# Patient Record
Sex: Female | Born: 1960 | Race: White | Hispanic: No
Health system: Midwestern US, Community
[De-identification: ages and names within clinical notes are randomized; demographics above are authoritative.]

## PROBLEM LIST (undated history)

## (undated) DIAGNOSIS — Z981 Arthrodesis status: Secondary | ICD-10-CM

## (undated) DIAGNOSIS — M4802 Spinal stenosis, cervical region: Secondary | ICD-10-CM

## (undated) DIAGNOSIS — G959 Disease of spinal cord, unspecified: Principal | ICD-10-CM

## (undated) DIAGNOSIS — M5412 Radiculopathy, cervical region: Secondary | ICD-10-CM

---

## 2011-08-27 LAB — URINALYSIS W/ RFLX MICROSCOPIC
Bilirubin: NEGATIVE
Blood: NEGATIVE
Glucose: NEGATIVE MG/DL
Ketone: NEGATIVE MG/DL
Leukocyte Esterase: NEGATIVE
Nitrites: NEGATIVE
Protein: NEGATIVE MG/DL
Specific gravity: 1.017 (ref 1.001–1.023)
Urobilinogen: 0.2 EU/DL (ref 0.2–1.0)
pH (UA): 7 (ref 5.0–9.0)

## 2011-08-27 LAB — CBC W/O DIFF
HCT: 35.8 % (ref 35.8–46.3)
HGB: 11.8 g/dL (ref 11.7–15.4)
MCH: 29.6 PG (ref 26.1–32.9)
MCHC: 33 g/dL (ref 31.4–35.0)
MCV: 89.9 FL (ref 79.6–97.8)
MPV: 11.5 FL (ref 10.8–14.1)
PLATELET: 273 10*3/uL (ref 150–450)
RBC: 3.98 M/uL — ABNORMAL LOW (ref 4.05–5.25)
RDW: 14.5 % (ref 11.9–14.6)
WBC: 7.7 10*3/uL (ref 4.3–11.1)

## 2011-08-27 LAB — METABOLIC PANEL, BASIC
Anion gap: 9 mmol/L (ref 7–16)
BUN: 9 MG/DL (ref 6–23)
CO2: 27 MMOL/L (ref 21–32)
Calcium: 8.7 MG/DL (ref 8.3–10.4)
Chloride: 105 MMOL/L (ref 98–107)
Creatinine: 0.72 MG/DL (ref 0.6–1.0)
GFR est AA: >60 mL/min/{1.73_m2} (ref >60)
GFR est non-AA: >60 mL/min/{1.73_m2} (ref >60)
Glucose: 88 MG/DL (ref 65–100)
Potassium: 4 MMOL/L (ref 3.5–5.1)
Sodium: 141 MMOL/L (ref 136–145)

## 2011-08-27 LAB — MSSA/MRSA SC BY PCR, NASAL SWAB

## 2011-08-27 NOTE — Other (Signed)
Negative results for MRSA/SA screening noted.

## 2011-08-27 NOTE — Other (Addendum)
Patient's guide to surgery given along with Pt education sheets regarding transfusions, pain management,smoking,and hand hygiene for the family and community. Pt verbalizes understanding of all pre-op instructions . Reinforced nothing to eat or drink after midnight on the day prior to surgery , this includes gum, mints, ice chips or water. Instructed that family must be present in building at all times.    Instructed Patient that they will be notified the day prior to surgery ( or the Friday before if surgery is on a Monday) of their arrival time by pre-op nurse with  Verbal understanding. Pt aware to bathe or shower with antibacterial soap on the night before  And am of surgery.      One packet Hibiclens given to pt along with education sheet. Instructed pt to use on the am of surgery and  to avoid face and genitals with hibiclens. Instructed to wear freshly laundered clothes.     No audible murmur heard at pre-assessment.     Patient safety information sheet given per registration with numbers for patient relations: [phone] and patient safety [phone]. Instructed pt that for any family member to obtain information or updates on pt's condition that they must have 4 digit code provided to them at registration. Pt verbalized understanding.     Instructed patient to continue  previous medications as prescribed prior to surgery and  to take cymbalta and opana if needed  On the Day of surgery with sip of water.  Medication reconciliation report given. Do not hold any medications that you have not been instructed to do either at your pre-assessment or per your surgeon.    Continue all previous medications unless otherwise directed.      Instructed patient to stop the following medications prior to surgery: none    TYPE   3  CASE   Labs per surgeon :no orders, MRSA/SA nasal swab, UA, incentive spirometry  Labs per grid :CBC BMP type and screen DOS  EKG  :  Not needed per protocol.   Spoke to [place] in [employer], reported to follow up with nasal swab.

## 2011-08-28 NOTE — Other (Signed)
Pt & Ptt need to be drawn DOS

## 2011-09-02 ENCOUNTER — Inpatient Hospital Stay
Admit: 2011-09-02 | Discharge: 2011-09-04 | Disposition: A | Discharge status: Home / Self Care | Payer: Worker's Compensation | Attending: Orthopaedic Surgery | Admitting: Orthopaedic Surgery

## 2011-09-02 DIAGNOSIS — M48062 Spinal stenosis, lumbar region with neurogenic claudication: Secondary | ICD-10-CM

## 2011-09-02 LAB — TYPE AND SCREEN
ABO/Rh: A POS
Antibody Screen: NEGATIVE

## 2011-09-02 LAB — PROTHROMBIN TIME + INR
INR: 1 (ref 0.9–1.2)
Prothrombin time: 10.7 s (ref 9.4–10.8)

## 2011-09-02 LAB — HCG URINE, QL. - POC: Pregnancy test,urine (POC): NEGATIVE

## 2011-09-02 LAB — TYPE & SCREEN
ABO/Rh(D): A POS
Antibody screen: NEGATIVE

## 2011-09-02 LAB — CBC W/O DIFF
HCT: 32.3 % — ABNORMAL LOW (ref 35.8–46.3)
HGB: 10.5 g/dL — ABNORMAL LOW (ref 11.7–15.4)
MCH: 29.4 PG (ref 26.1–32.9)
MCHC: 32.5 g/dL (ref 31.4–35.0)
MCV: 90.5 FL (ref 79.6–97.8)
MPV: 11.1 FL (ref 10.8–14.1)
PLATELET: 252 10*3/uL (ref 150–450)
RBC: 3.57 M/uL — ABNORMAL LOW (ref 4.05–5.25)
RDW: 14.3 % (ref 11.9–14.6)
WBC: 13.5 10*3/uL — ABNORMAL HIGH (ref 4.3–11.1)

## 2011-09-02 LAB — GLUCOSE, POC: Glucose (POC): 79 mg/dL (ref 65–100)

## 2011-09-02 LAB — PTT: aPTT: 26.8 s (ref 23.5–31.7)

## 2011-09-02 MED ORDER — ACETAMINOPHEN 1,000 MG/100 ML (10 MG/ML) IV
1000 mg/100 mL (10 mg/mL) | Freq: Once | INTRAVENOUS | Status: AC
Start: 2011-09-02 — End: 2011-09-02
  Administered 2011-09-02: 18:00:00 via INTRAVENOUS

## 2011-09-02 MED ORDER — SODIUM CHLORIDE 0.9 % IV PIGGY BACK
1 gram | Freq: Once | INTRAVENOUS | Status: AC
Start: 2011-09-02 — End: 2011-09-02
  Administered 2011-09-02: 16:00:00 via INTRAVENOUS

## 2011-09-02 MED ADMIN — rocuronium (ZEMURON) injection: INTRAVENOUS | @ 16:00:00 | NDC 67457022810

## 2011-09-02 MED ADMIN — ondansetron (ZOFRAN) injection: INTRAVENOUS | @ 18:00:00 | NDC 00781301095

## 2011-09-02 MED ADMIN — fentaNYL citrate (PF) injection: INTRAVENOUS | @ 16:00:00 | NDC 10019003867

## 2011-09-02 MED ADMIN — HYDROmorphone (DILAUDID) 20 mg / 20 mL PCA: INTRAVENOUS | @ 20:00:00 | NDC 99991090520

## 2011-09-02 MED ADMIN — dexamethasone (DECADRON) 4 mg/mL injection: INTRAVENOUS | @ 16:00:00 | NDC 63323016501

## 2011-09-02 MED ADMIN — fentaNYL citrate (PF) injection: INTRAVENOUS | @ 19:00:00 | NDC 10019003867

## 2011-09-02 MED ADMIN — ceFAZolin (ANCEF) 1 g in sodium chloride irrigation 0.9 % 1,000 mL solution: @ 17:00:00 | NDC 00781345196

## 2011-09-02 MED ADMIN — docusate sodium (COLACE) capsule 100 mg: ORAL | @ 21:00:00 | NDC 62584068311

## 2011-09-02 MED ADMIN — fentaNYL citrate (PF) injection: INTRAVENOUS | @ 17:00:00 | NDC 10019003867

## 2011-09-02 MED ADMIN — HYDROmorphone (DILAUDID) injection 0.5 mg: INTRAVENOUS | @ 19:00:00 | NDC 00641012121

## 2011-09-02 MED ADMIN — HYDROmorphone (DILAUDID) injection 0.5 mg: INTRAVENOUS | @ 20:00:00 | NDC 00641012121

## 2011-09-02 MED ADMIN — lidocaine (PF) (XYLOCAINE) 20 mg/mL (2 %) injection: INTRAVENOUS | @ 16:00:00 | NDC 00409428202

## 2011-09-02 MED ADMIN — propofol (DIPRIVAN) 10 mg/mL injection: INTRAVENOUS | @ 16:00:00 | NDC 63323027025

## 2011-09-02 MED ADMIN — dextrose 5% - 0.45% NaCl with KCl 20 mEq/L infusion: INTRAVENOUS | @ 21:00:00 | NDC 00409790209

## 2011-09-02 MED ADMIN — ondansetron (ZOFRAN) injection 4 mg: INTRAVENOUS | @ 20:00:00 | NDC 00409475503

## 2011-09-02 MED ADMIN — famotidine (PEPCID) tablet 20 mg: ORAL | @ 14:00:00 | NDC 68084017211

## 2011-09-02 MED ADMIN — lactated ringers infusion: INTRAVENOUS | @ 16:00:00 | NDC 00409795309

## 2011-09-02 MED ADMIN — sodium chloride (NS) flush 5-10 mL: INTRAVENOUS | NDC 87701099893

## 2011-09-02 MED ADMIN — rocuronium (ZEMURON) injection: INTRAVENOUS | @ 18:00:00 | NDC 67457022810

## 2011-09-02 MED ADMIN — HYDROmorphone (DILAUDID) 20 mg / 20 mL PCA: INTRAVENOUS | NDC 99991090520

## 2011-09-02 MED ADMIN — oxyCODONE IR (ROXICODONE) tablet 5 mg: ORAL | @ 19:00:00 | NDC 72162174902

## 2011-09-02 MED ADMIN — glycopyrrolate (ROBINUL) injection: INTRAMUSCULAR | @ 19:00:00 | NDC 00517460225

## 2011-09-02 MED ADMIN — midazolam (VERSED) injection 2 mg: INTRAVENOUS | @ 15:00:00 | NDC 00409230517

## 2011-09-02 MED ADMIN — ondansetron (ZOFRAN) injection 4 mg: INTRAVENOUS | @ 21:00:00 | NDC 00409475503

## 2011-09-02 MED ADMIN — oxymorphone (OPANA) tablet Tab 5 mg: ORAL | @ 22:00:00 | NDC 63481076106

## 2011-09-02 MED ADMIN — fentaNYL citrate (PF) injection: INTRAVENOUS | @ 18:00:00 | NDC 10019003867

## 2011-09-02 MED ADMIN — neostigmine (PROSTIGMINE) injection: INTRAVENOUS | @ 19:00:00 | NDC 10019027010

## 2011-09-02 MED ADMIN — HYDROmorphone (DILAUDID) 20 mg / 20 mL PCA: INTRAVENOUS | @ 21:00:00 | NDC 99991090520

## 2011-09-02 MED ADMIN — phenylephrine 100 mcg/mL syringe (for anesthesia use only): INTRAVENOUS | @ 17:00:00 | NDC 00641614201

## 2011-09-02 MED ADMIN — thrombin (bovine) (THROMBIN-JMI) 5,000 unit topical solution: TOPICAL | @ 17:00:00 | NDC 52604710201

## 2011-09-02 MED ADMIN — lactated ringers infusion: INTRAVENOUS | @ 19:00:00 | NDC 11845118709

## 2011-09-02 MED ADMIN — sodium chloride (NS) flush 5-10 mL: INTRAVENOUS | @ 21:00:00 | NDC 87701099893

## 2011-09-02 MED ADMIN — 0.9% sodium chloride infusion: INTRAVENOUS | @ 17:00:00 | NDC 00409798423

## 2011-09-02 MED ADMIN — 0.9% sodium chloride infusion: INTRAVENOUS | @ 18:00:00 | NDC 00409798423

## 2011-09-02 MED ADMIN — lactated ringers infusion: INTRAVENOUS | @ 14:00:00 | NDC 00409795309

## 2011-09-02 MED ADMIN — lidocaine (XYLOCAINE) 10 mg/mL (1 %) injection 0.1 mL: SUBCUTANEOUS | @ 14:00:00 | NDC 00409427601

## 2011-09-02 MED FILL — ONDANSETRON (PF) 4 MG/2 ML INJECTION: 4 mg/2 mL | INTRAMUSCULAR | Qty: 2

## 2011-09-02 MED FILL — HYDROMORPHONE 2 MG/ML INJECTION SOLUTION: 2 mg/mL | INTRAMUSCULAR | Qty: 1

## 2011-09-02 MED FILL — SODIUM CHLORIDE 0.9 % IV PIGGY BACK: INTRAVENOUS | Qty: 50

## 2011-09-02 MED FILL — CEFAZOLIN 1 GRAM SOLUTION FOR INJECTION: 1 gram | INTRAMUSCULAR | Qty: 1000

## 2011-09-02 MED FILL — OFIRMEV 1,000 MG/100 ML (10 MG/ML) INTRAVENOUS SOLUTION: 1000 mg/100 mL (10 mg/mL) | INTRAVENOUS | Qty: 100

## 2011-09-02 MED FILL — HYDROMORPHONE 20 MG/20 ML (1 MG/ML) IN 0.9% NACL PCA SYRINGE: 20 mg/ ml | INTRAVENOUS | Qty: 20

## 2011-09-02 MED FILL — FAMOTIDINE 20 MG TAB: 20 mg | ORAL | Qty: 1

## 2011-09-02 MED FILL — MIDAZOLAM 1 MG/ML IJ SOLN: 1 mg/mL | INTRAMUSCULAR | Qty: 2

## 2011-09-02 MED FILL — D5-1/2 NS & POTASSIUM CHLORIDE 20 MEQ/L IV: 20 mEq/L | INTRAVENOUS | Qty: 1000

## 2011-09-02 MED FILL — DOCUSATE SODIUM 100 MG CAP: 100 mg | ORAL | Qty: 1

## 2011-09-02 NOTE — Anesthesia Pre-Procedure Evaluation (Signed)
Anesthetic History   No history of anesthetic complications           Review of Systems / Medical History  Patient summary reviewed, nursing notes reviewed and pertinent labs reviewed    Pulmonary  Within defined limits               Neuro/Psych   Within defined limits           Cardiovascular    Hypertension            Exercise tolerance: >4 METS     GI/Hepatic/Renal               Comments: H/O kidney stones   Endo/Other          Pertinent negatives: No diabetes   Other Findings                        Physical Exam    Airway  Mallampati: II  TM Distance: 4 - 6 cm  Neck ROM: normal range of motion        Cardiovascular  Regular rate and rhythm,  S1 and S2 normal,  no murmur, click, rub, or gallop  Rhythm: regular           Dental    Dentition: Lower dentition intact  Comments: Upper plate   Pulmonary  Breath sounds clear to auscultation               Abdominal         Other Findings                          Anesthetic Plan    ASA: 2  Anesthesia type: general          Induction: Intravenous  Anesthetic plan and risks discussed with: Patient      Discussed anesthesia plan and risks with patient who consents to proceed with GETA

## 2011-09-02 NOTE — Brief Op Note (Signed)
BRIEF OPERATIVE NOTE    Date of Procedure: 09/02/2011   Preoperative Diagnosis: SPONDYLOLISTHESIS/SPINAL STENOSIS L3-4  Postoperative Diagnosis: SPONDYLOLISTHESIS/SPINAL STENOSIS    Procedure: Procedure(Sharry Beining):  REVISION BILATERAL  L3-L4 LAMINECTOMY WITH FUSION, BONE MARROW ASPIRATION,  TLIF AND INSTRUMENTATION L3-5      Surgeon(Yarah Fuente) and Role:     * Judd Lien, MD - Primary     * Roe Rutherford, MD - Assisting  Anesthesia: General   Estimated Blood Loss: 200cc  Specimens: * No specimens in log *   Findings: stenosis   Complications: none  Implants:   Implant Name Type Inv. Item Serial No. Manufacturer Lot No. LRB No. Used Action   GRAFT BNE VITOSS SUB [id number] - [id number]  GRAFT BNE VITOSS SUB [id number]   [id number] Bilateral 1 Implanted   6.5 X 30 SCREWS     XXXXXXX Bilateral 4 Explanted   RODS     XXXXXX Bilateral 2 Explanted   SET SCREWS     XXXXXX Bilateral 4 Explanted   CONNECTORS     XXXXX Bilateral 4 Explanted   SPACER SPNE VERT AVS 4D 7X25 - [id number]  SPACER SPNE VERT AVS 4D 7X25   [id number] Bilateral 1 Implanted   BLOCKER SPNE XIA 3 TI - [id number]  BLOCKER SPNE XIA 3 TI  STRYKER SPINE HOWM [id number] Bilateral 6 Implanted   SCR SPNE XIA 3 6.5X40MM TI - [id number]  SCR SPNE XIA 3 6.5X40MM TI   [id number] Bilateral 3 Implanted   RODS     [id number] Bilateral 1 Implanted   SCR SPNE XIA 3 6.5X35MM TI - [id number]  SCR SPNE XIA 3 6.5X35MM TI   [id number] Bilateral 2 Implanted   SCR SPNE XIA 3 6.5X45MM TI - [id number]  SCR SPNE XIA 3 6.5X45MM TI  STRYKER SPINE HOWM [id number] Bilateral 1 Implanted   ROD SPNE XIA 3 6.0X80MM TI - [id number]   ROD SPNE XIA 3 6.0X80MM TI   STRYKER SPINE HOWM [id number] Bilateral 1 Implanted

## 2011-09-02 NOTE — Other (Addendum)
Patient's dentures given to me by family and given to PACU nurse Larita Fife RN

## 2011-09-02 NOTE — Progress Notes (Signed)
TRANSFER - IN REPORT:    Verbal report received from Murtis Sink, RN (name) on Kristin [name]  being received from PACU (unit) for routine post - op      Report consisted of patient???s Situation, Background, Assessment and   Recommendations(SBAR).     Information from the following report(s) OR Summary was reviewed with the receiving nurse.    Opportunity for questions and clarification was provided.      Assessment completed upon patient???s arrival to unit and care assumed.         DUAL SKIN ASSESSMENT:   Dual skin assessment performed with Fredda Hammed, RN. Incision noted to mid back CDI with hemovac in place. Skin is otherwise intact.

## 2011-09-02 NOTE — Other (Signed)
1526- Pain medication given.

## 2011-09-02 NOTE — Other (Signed)
1452- From OR. VSS. Drowsy, awakens easily. C/o pain on arrival. Pain medication given by CRNA at bedside. Incs to mid to lower back D&I. Hemovac drain intact with minimal bloody drainage noted.    1508- C/o pain, medication given.    1516- Remains to c/o pain to incisional site. Pain medication given.

## 2011-09-02 NOTE — Other (Signed)
1545- Resting with eyes closed. Will wake to c/o pain. Encouraged use of PCA. Dr. Samuel Bouche at bedside. Dsg remains D&I to mid to lower back. Hemovac drain with moderate amt of bloody drainage. MAE w/o limitations.    TRANSFER - OUT REPORT:    Verbal report given to Jen,RN on Luanna A Mongiello  being transferred to room 725 for routine post - op       Report consisted of patient???s Situation, Background, Assessment and   Recommendations(SBAR).     Information from the following report(s) SBAR, Kardex, OR Summary, Procedure Summary, Intake/Output, MAR and Recent Results was reviewed with the receiving nurse.    Opportunity for questions and clarification was provided.      VTE prophylaxis orders have been written.

## 2011-09-02 NOTE — Progress Notes (Signed)
Patient complaining of sharp chest pain. Dr. Samuel Bouche paged. EKG ordered. Will monitor chest pain. Patient states at the moment that "it is easing off."

## 2011-09-02 NOTE — Anesthesia Post-Procedure Evaluation (Signed)
Post-Anesthesia Evaluation and Assessment    Patient: Kristin [name] MRN: [id number]  SSN: [id number]    Date of Birth: 1960/07/11  Age: 51 y.o.  Sex: female       Cardiovascular Function/Vital Signs  Visit Vitals   Item Reading   ??? BP 157/98   ??? Pulse 66   ??? Temp 36.5 ??C (97.7 ??F)   ??? Resp 18   ??? Ht 5' (1.524 m)   ??? Wt 69.202 kg (152 lb 9 oz)   ??? BMI 29.80 kg/m2   ??? SpO2 100%       Patient is status post General anesthesia for Procedure(s):  REVISION BILATERAL  L3-L4 LAMINECTOMY WITH FUSION, BONE MARROW ASPIRATION,  TLIF AND INSTRUMENTATION    .    Nausea/Vomiting: None    Postoperative hydration reviewed and adequate.    Pain:  Pain Scale 1: Numeric (0 - 10) (09/02/11 1533)  Pain Intensity 1: 6 (09/02/11 1533)   Managed    Neurological Status:   Neuro (WDL): Exceptions to WDL (09/02/11 1452)  Neuro  Neurologic State: Drowsy;Eyes open spontaneously (09/02/11 1452)   At baseline    Mental Status and Level of Consciousness: Alert and oriented to person, place, and time    Pulmonary Status:   O2 Device: Nasal cannula (4L/min) (09/02/11 1452)   Adequate oxygenation and airway patent    Complications related to anesthesia: None    Post-anesthesia assessment completed. No concerns    Signed By: Hal Neer, MD     September 02, 2011

## 2011-09-02 NOTE — Other (Signed)
Family updated at 1:24 PM by Janeice Robinson, RN.  Spoke with daughter, 4 digit code obtained  Pre op pt stated has low back pian with increased pain down left leg

## 2011-09-02 NOTE — Progress Notes (Signed)
Pre-surgery consult. Patient already in surgery. Offered silent prayer for patient and medical team.  David R. Gillespie, M.Div  Chaplain  [facility]

## 2011-09-03 LAB — EKG, 12 LEAD, INITIAL
Atrial Rate: 66 {beats}/min
Calculated P Axis: 62 degrees
Calculated R Axis: 21 degrees
Calculated T Axis: 33 degrees
Diagnosis: NORMAL
P-R Interval: 176 ms
Q-T Interval: 440 ms
QRS Duration: 80 ms
QTC Calculation (Bezet): 461 ms
Ventricular Rate: 66 {beats}/min

## 2011-09-03 LAB — METABOLIC PANEL, BASIC
Anion gap: 8 mmol/L (ref 7–16)
BUN: 4 MG/DL — ABNORMAL LOW (ref 6–23)
CO2: 25 MMOL/L (ref 21–32)
Calcium: 7.4 MG/DL — ABNORMAL LOW (ref 8.3–10.4)
Chloride: 110 MMOL/L — ABNORMAL HIGH (ref 98–107)
Creatinine: 0.7 MG/DL (ref 0.6–1.0)
GFR est AA: >60 mL/min/{1.73_m2} (ref >60)
GFR est non-AA: >60 mL/min/{1.73_m2} (ref >60)
Glucose: 173 MG/DL — ABNORMAL HIGH (ref 65–100)
Potassium: 4.1 MMOL/L (ref 3.5–5.1)
Sodium: 143 MMOL/L (ref 136–145)

## 2011-09-03 LAB — CBC W/O DIFF
HCT: 29.1 % — ABNORMAL LOW (ref 35.8–46.3)
HGB: 9.7 g/dL — ABNORMAL LOW (ref 11.7–15.4)
MCH: 29.8 PG (ref 26.1–32.9)
MCHC: 33.3 g/dL (ref 31.4–35.0)
MCV: 89.5 FL (ref 79.6–97.8)
MPV: 11.6 FL (ref 10.8–14.1)
PLATELET: 275 10*3/uL (ref 150–450)
RBC: 3.25 M/uL — ABNORMAL LOW (ref 4.05–5.25)
RDW: 14.1 % (ref 11.9–14.6)
WBC: 15.6 10*3/uL — ABNORMAL HIGH (ref 4.3–11.1)

## 2011-09-03 MED ADMIN — oxyCODONE IR (ROXICODONE) tablet 10 mg: ORAL | @ 19:00:00 | NDC 00406055223

## 2011-09-03 MED ADMIN — sodium chloride (NS) flush 5-10 mL: INTRAVENOUS | @ 17:00:00 | NDC 87701099893

## 2011-09-03 MED ADMIN — ceFAZolin (ANCEF) 1 g in 0.9% sodium chloride (MBP/ADV) 50 mL MBP: INTRAVENOUS | NDC 00781345170

## 2011-09-03 MED ADMIN — ondansetron (ZOFRAN) injection 4 mg: INTRAVENOUS | @ 14:00:00 | NDC 00409475503

## 2011-09-03 MED ADMIN — acetaminophen (OFIRMEV) infusion 1,000 mg: INTRAVENOUS | NDC 43825010201

## 2011-09-03 MED ADMIN — dextrose 5% - 0.45% NaCl with KCl 20 mEq/L infusion: INTRAVENOUS | @ 07:00:00 | NDC 00409790209

## 2011-09-03 MED ADMIN — oxyCODONE CR (OXYCONTIN) tablet 10 mg: ORAL | @ 08:00:00 | NDC 59011041020

## 2011-09-03 MED ADMIN — ondansetron (ZOFRAN) injection 4 mg: INTRAVENOUS | NDC 00409475503

## 2011-09-03 MED ADMIN — oxyCODONE IR (ROXICODONE) tablet 10 mg: ORAL | @ 23:00:00 | NDC 00406055223

## 2011-09-03 MED ADMIN — acetaminophen (OFIRMEV) infusion 1,000 mg: INTRAVENOUS | @ 13:00:00 | NDC 43825010201

## 2011-09-03 MED ADMIN — docusate sodium (COLACE) capsule 100 mg: ORAL | @ 21:00:00 | NDC 62584068311

## 2011-09-03 MED ADMIN — ceFAZolin (ANCEF) 1 g in 0.9% sodium chloride (MBP/ADV) 50 mL MBP: INTRAVENOUS | @ 17:00:00 | NDC 00781345170

## 2011-09-03 MED ADMIN — docusate sodium (COLACE) capsule 100 mg: ORAL | @ 13:00:00 | NDC 62584068311

## 2011-09-03 MED ADMIN — acetaminophen (OFIRMEV) infusion 1,000 mg: INTRAVENOUS | @ 07:00:00 | NDC 43825010201

## 2011-09-03 MED ADMIN — DULoxetine (CYMBALTA) capsule 60 mg: ORAL | @ 13:00:00 | NDC 00002327001

## 2011-09-03 MED ADMIN — famotidine (PEPCID) tablet 20 mg: ORAL | @ 13:00:00 | NDC 68084017211

## 2011-09-03 MED ADMIN — acetaminophen (OFIRMEV) infusion 1,000 mg: INTRAVENOUS | @ 18:00:00 | NDC 43825010201

## 2011-09-03 MED ADMIN — oxyCODONE CR (OXYCONTIN) tablet 10 mg: ORAL | @ 21:00:00 | NDC 59011041020

## 2011-09-03 MED ADMIN — ceFAZolin (ANCEF) 1 g in 0.9% sodium chloride (MBP/ADV) 50 mL MBP: INTRAVENOUS | @ 08:00:00 | NDC 00781345170

## 2011-09-03 MED ADMIN — sodium chloride (NS) flush 5-10 mL: INTRAVENOUS | @ 02:00:00 | NDC 87701099893

## 2011-09-03 MED ADMIN — HYDROmorphone (DILAUDID) 20 mg / 20 mL PCA: INTRAVENOUS | @ 11:00:00 | NDC 99991090520

## 2011-09-03 MED ADMIN — sodium chloride (NS) flush 5-10 mL: INTRAVENOUS | NDC 87701099893

## 2011-09-03 MED ADMIN — famotidine (PEPCID) tablet 20 mg: ORAL | NDC 68084017211

## 2011-09-03 MED FILL — OXYCODONE 5 MG TAB: 5 mg | ORAL | Qty: 2

## 2011-09-03 MED FILL — PHENYLEPHRINE 10 MG/ML INJECTION: 10 mg/mL | INTRAMUSCULAR | Qty: 100

## 2011-09-03 MED FILL — OXYCONTIN 10 MG TABLET,EXTENDED RELEASE: 10 mg | ORAL | Qty: 1

## 2011-09-03 MED FILL — DOCUSATE SODIUM 100 MG CAP: 100 mg | ORAL | Qty: 1

## 2011-09-03 MED FILL — ONDANSETRON (PF) 4 MG/2 ML INJECTION: 4 mg/2 mL | INTRAMUSCULAR | Qty: 4

## 2011-09-03 MED FILL — SODIUM CHLORIDE 0.9 % IV: INTRAVENOUS | Qty: 2000

## 2011-09-03 MED FILL — ROCURONIUM 10 MG/ML IV: 10 mg/mL | INTRAVENOUS | Qty: 55

## 2011-09-03 MED FILL — OFIRMEV 1,000 MG/100 ML (10 MG/ML) INTRAVENOUS SOLUTION: 1000 mg/100 mL (10 mg/mL) | INTRAVENOUS | Qty: 100

## 2011-09-03 MED FILL — FENTANYL CITRATE (PF) 50 MCG/ML IJ SOLN: 50 mcg/mL | INTRAMUSCULAR | Qty: 500

## 2011-09-03 MED FILL — NEOSTIGMINE METHYLSULFATE 1 MG/ML INJECTION: 1 mg/mL | INTRAMUSCULAR | Qty: 4

## 2011-09-03 MED FILL — CYMBALTA 60 MG CAPSULE,DELAYED RELEASE: 60 mg | ORAL | Qty: 1

## 2011-09-03 MED FILL — DEXAMETHASONE SODIUM PHOSPHATE 4 MG/ML IJ SOLN: 4 mg/mL | INTRAMUSCULAR | Qty: 10

## 2011-09-03 MED FILL — FAMOTIDINE 20 MG TAB: 20 mg | ORAL | Qty: 1

## 2011-09-03 MED FILL — GLYCOPYRROLATE 0.2 MG/ML IJ SOLN: 0.2 mg/mL | INTRAMUSCULAR | Qty: 0.6

## 2011-09-03 MED FILL — ONDANSETRON (PF) 4 MG/2 ML INJECTION: 4 mg/2 mL | INTRAMUSCULAR | Qty: 2

## 2011-09-03 MED FILL — D5-1/2 NS & POTASSIUM CHLORIDE 20 MEQ/L IV: 20 mEq/L | INTRAVENOUS | Qty: 1000

## 2011-09-03 MED FILL — CEFAZOLIN 1 GRAM SOLUTION FOR INJECTION: 1 gram | INTRAMUSCULAR | Qty: 1000

## 2011-09-03 MED FILL — PROPOFOL 10 MG/ML IV EMUL: 10 mg/mL | INTRAVENOUS | Qty: 200

## 2011-09-03 MED FILL — LIDOCAINE (PF) 20 MG/ML (2 %) IJ SOLN: 20 mg/mL (2 %) | INTRAMUSCULAR | Qty: 60

## 2011-09-03 NOTE — Progress Notes (Signed)
Problem: Mobility Impaired (Adult and Pediatric)  Goal: *Acute Goals and Plan of Care (Insert Text)  (1.)Kristin [name] will move from supine to sit and sit to supine , scoot up and down and roll side to side in bed with INDEPENDENT within 7 day(s).   (2.)Kristin [name] will transfer from bed to chair and chair to bed with MODIFIED INDEPENDENCE using the least restrictive device within 7 day(s).   (3.)Kristin [name] will ambulate with MODIFIED INDEPENDENCE for 500 feet with the least restrictive device within 7 day(s).  (4.)Kristin [name] will participate in BLE therapy exercises x 15 reps to increase strength for safety and independence with ADL???s and functional mobility within 7 days.  (5.)Kristin [name] will ascend/descend 4 stairs with bilateral rails with MODIFIED INDEPENDENCE to be able to get in/out of her house within 7 days.   ________________________________________________________________________________________________  PHYSICAL THERAPY: INITIAL ASSESSMENT AND AM  INPATIENT    NAME/AGE/GENDER: Kristin [name] is a 51 y.o. female  DATE: 09/03/2011  PRIMARY DIAGNOSIS: SPONDYLOLISTHESIS/SPINAL STENOSIS  Spinal stenosis, lumbar region, with neurogenic claudication  Procedure(s) (LRB):  SPINE TRANSFORAMINAL LUMBAR INTERBODY FUSION (TLIF) (Bilateral) 1 Day Post-Op     INTERDISCIPLINARY COLLABORATION: Physical Therapist and Registered Nurse  ASSESSMENT:   Kristin [name] is s/p above procedure. Educated pt on log rolling technique and spinal precautions. Presents with overall decreased strength and balance. She is very motivated to work with therapy and got upset when she was feeling nauseous and dizzy (she just wanted to walk). Required assistance with wiping only due to all of the lines. Assisted with donning brace. She ambulated into hallway when she began feeling sick. Chair was brought up behind her and BP taken (see doc flow, entered by RN mark). Started to feel better and walked a little farther. Again got dizzy and sat  back down. Ambulated back to room and RN [facility] notified. Will continue to follow in acute care to address her deficits.       ????????This section established at most recent assessment??????????   PROBLEM LIST (Impairments causing functional limitations):  1. Decreased Strength affecting function   2. Decreased ADL/Functional Activities   3. Decreased Transfer Abilities   4. Decreased Ambulation Ability/Technique   5. Decreased Balance   6. Increased Pain affecting function   REHABILITATION POTENTIAL FOR STATED GOALS: GOOD      PLAN OF CARE:   INTERVENTIONS PLANNED: (Benefits and precautions of physical therapy have been discussed with the patient.)  1. balance exercise   2. bed mobility   3. family education   4. gait training   5. home exercise program (HEP)   6. neuromuscular re-education/strengthening   7. range of motion: active/assisted/passive   8. therapeutic activities   9. therapeutic exercise/strengthening   10. transfer training   FREQUENCY/DURATION: Follow patient 1-2 times per day/4-7 days per week until goals are met in order to address above goals.    RECOMMENDED REHABILITATION/EQUIPMENT: (at time of discharge pending progress):   None.  SUBJECTIVE:   "I just want to walk"    Present Symptoms:    Pain Intensity 1: 0  Pain Location 1: Back  Pain Orientation 1: Lower;Mid  Pain Intervention(s) 1: Medication (see MAR)  History of Present Injury/Illness: s/p above procedure  Prior Level of Function/Home Situation: Pt lives with daughter. She was independent with ADL's and ambulating without a device prior to admission.   Tub or Shower Type: Shower  OBJECTIVE/TREATMENT:   (In addition to Assessment/Re-Assessment sessions the following treatments were  rendered)  Most Recent Physical Functioning:   Gross Assessment:   AROM: Generally decreased, functional  Strength: Generally decreased, functional  Sensation: Impaired (decreased to light touch LLE)  Posture:  Posture (WDL): Exceptions to WDL  Posture  Assessment: Forward head;Rounded shoulders  Balance:  Sitting: Intact  Standing: Impaired  Standing - Static: Good  Standing - Dynamic : Fair  Bed Mobility:  Rolling: Supervision  Supine to Sit: Supervision  Sit to Supine: Supervision  Wheelchair Mobility:     Transfers:  Sit to Stand: Supervision  Stand to Sit: Supervision  Bed to Chair: CGA  Gait:     Base of Support: Narrowed  Speed/Cadence: Pace decreased (<100 feet/min)  Step Length: Left shortened;Right shortened  Gait Abnormalities: Trunk sway increased  Distance (ft):  (15 feet, 20 feet, 5 feet)  Assistive Device: Other (comment) (IV pole)  Ambulation - Level of Assistance: CGA;Minimal assistance  Interventions: Safety awareness training;Verbal cues      Therapeutic Activity: (    25 Minutes):  Therapeutic activities including Bed transfers, Chair transfers, Toilet transfers and Ambulation on level ground to improve mobility, strength and balance.  Required minimal Safety awareness training;Verbal cues to promote dynamic balance in standing and promote motor control of bilateral, lower extremity(s).     Braces/Orthotics/Lines/Etc:   ?? IV   ?? drain and lumbar corset   ?? O2 Device: Nasal cannula   Safety:   After treatment position/precautions:  ?? Supine in bed   ?? Bed/Chair-wheels locked   ?? Call light within reach   ?? RN notified   ?? Family at bedside   Progression/Medical Necessity:   ?? Patient demonstrates good rehab potential due to higher previous functional level.   Compliance with Program/Exercises: compliant all of the time, Will assess as treatment progresses.   Reason for Continuation of Services/Other Comments:  ?? Patient continues to require skilled intervention due to not functioning at baseline.   Recommendations/Intent for next treatment session: Treatment next visit will focus on advancements to more challenging activities and reduction in assistance provided.  Total Treatment Duration:  Time In: 0900  Time Out: 0938  Lanice Shirts. Maurine Minister, DPT

## 2011-09-03 NOTE — Op Note (Signed)
[facility]                            [address]                           [place], [place] [address]                                [phone]                                OPERATIVE REPORT    NAME:  Kristin [name], Kristin [name]                           MR:  [id number]  LOC:                        SEX:  F               ACCT:  [id number]  DOB:  03/16/1960            AGE:  51              PT:  I  ADMIT:  09/02/2011          DSCH:  09/02/2011     MSV:      DATE: 09/02/2011    PREPROCEDURE DIAGNOSIS: Prior L4-L5 instrumented fusion with subsequent  development of L3-4 spinal stenosis and spondylolisthesis.    POSTPROCEDURE DIAGNOSIS: Prior L4-L5 instrumented fusion with subsequent  development of L3-4 spinal stenosis and spondylolisthesis.    NAME OF PROCEDURE  1. Bilateral redo L3 and L4 laminectomy with medial partial facetectomy.  2. L3-4 posterolateral fusion.  3. L3-4 posterior lumbar interbody fusion.  4. Placement of biomechanical interbody device at L3-L4 using the Stryker  biomechanical implant.  5. Placement of segmental spinal instrumentation L3-L5 using the Stryker  hardware.  6. Left iliac crest bone marrow aspirate.  7. Use of Vitoss bone graft substitute for fusion.    SURGEON: Silas E. Lucas III, MD    ASSISTANT: Christopher D. VanPelt, MD    ANESTHESIA: GETA.    ESTIMATED BLOOD LOSS: 200 mL.    FLUIDS: 1700 mL of crystalloid.    DRAINS: One Hemovac.    SPECIMENS: None.    INDICATIONS: The patient is [name] 51-year-old white female who multiple years  previously had undergone an instrumented L4-L5 laminectomy, fusion,  instrumentation and done well. She subsequently had progressively  worsening pain. She does see [name] pain management doctor on [name] chronic basis,  but this pain has worsened. She was worked up and found to have spinal  stenosis and spondylolisthesis at L3-4 above her prior fusion. She failed  conservative treatment with anti-inflammatories and epidural blocks  and  was admitted for surgical intervention.    DESCRIPTION OF PROCEDURE: The patient was brought to the operating room.  After administration of anesthesia, IV antibiotics and placement of  monitoring lines, she was positioned prone on the Jackson frame. Her back  was prepped with Betadine and sterilely draped. It should be noted that  an assistant was medically necessary for this case because of the  extensive soft tissue and neural retraction involved. Scalpel was used to  reenter   her midline lower back incision. This was extended cephalad. We  dissected down to the subcutaneous tissue to the deep fascia. Deep fascia  was incised on either side of the spinous processes cephalad and we  dissected out laterally, exposed the previous instrumentation. Once we  had done that, we cleaned the scar tissue and bone off from around and  over the top of the implants and then we used the Medtronic  Instruments for hardware removal to remove the locking nuts and then the rods,  and then the connectors and then the screws. The screws were measured for  length. All 4 screws were 6.5 diameter. Once we had done that, we  stripped out the lateral gutter from the L3 transverse process down to  the L4 and stripped off the soft tissue off the dorsal aspect of the  spine. Once we had done that sufficiently, we were ready to do our  decompression. We removed the spinous processes of L3 and L4 with [name]  rongeur, scraped scar tissue off the bony margins and then used [name]  Kerrison punch to perform [name] bilateral L3 and L4 redo laminectomy, medial  partial facetectomy with carefully retracting the neural structures to  prevent injury. Once we had done that, the thecal sac was free. There was  very thickened ligamentum flavum that was removed. We checked out through  the foramen above and below, and these were free. We cauterized epidural  veins on either side and then we elected to place our biomechanical  interbody device from the right side.  The dura was retracted towards the  midline to the left and then we incised the disk annulus with [name] scalpel.  The disk was very collapsed down. We used the distractors to distract and  we really only distracted to 7 mm. We used the 7 and then the 8-mm  posterior cutter to remove endplate from the posterior aspect and then we  removed disk with pituitary rongeurs. After we removed all the loose  disk, we placed [name] 7-mm trial from the right side and into the interbody  space. It was very tight and felt like this was the appropriate size. So  this was removed. We harvested [name] bone marrow aspirate from the left iliac  crest by inserting the needle into the crest, removing the stylet and  suctioning off 10 mL of bone marrow aspirate which was then placed on 10  mL of Vitoss bone graft substitute. We then packed [name] 7-mm biomechanical  Stryker interbody device with the Vitoss and with the neural structures  retracted, we inserted it from the right side, tamped it anteriorly, it  did not cross the midline, however, and we could not get it to go across.  We did distract through the spinous processes with the lamina spreader to  try to facilitate placement of the cage. Once this had been placed, we  thoroughly irrigated the wound multiple times, suctioned it dry. We  decorticated the transverse process of L3 as well as L4 transverse  processes and the fusion mass at L4-5 and lateral pars area bilaterally.  We then made starting holes with [name] starting awl at the L3 level  bilaterally and inserted the Steffee probe down through. We probed it and  no breaches were found, tapped it with [name] 5.5-mm tap, reprobed it and no  breaches were found. We then inserted 6.5 screws of varying lengths at  each level bilaterally and then fashioned rods to fit into the   screws  bilaterally, the Vitoss bone graft substitute had been placed in the  lateral gutters from the L3 transverse process down to L4 and along the  lateral bony margins. The rods  were placed into the screws bilaterally  top tightening, locking nuts were placed and then securely torqued tight  with the torque wrench. After we had done this, AP and lateral C-arm  x-rays were obtained, which showed good alignment of the spine, good  position and instrumentation as well as the interbody cage. The interbody  cage was still somewhat off to the right side, it was not positioned in  the midline, but it was well anterior and felt to be very stable. At this  point, satisfied with the x-rays, we were ready to close the wound. [name]  Hemovac drain was placed deep in the wound and brought out through [name]  separate proximal stab incision. The deep fascia was then reapproximated  with interrupted figure-of-eight stitches of #1 Vicryl. Subcutaneous  tissue was closed over this with interrupted stitches of 2-0 Vicryl and  the skin was closed with [name] running subcuticular stitch of 3-0 Vicryl.  Steri-Strips and dry sterile dressings were applied. The patient was then  rolled supine onto the recovery room bed, having tolerated the procedure  well with no apparent complications.                Silas E Lucas, III, MD    [name]                This is an unverified document unless signed by physician.    TID:  [id number]                                      DT:  09/03/2011 11:47 [name]  JOB:  [id number]        DOC#:  [id number]           DD:  09/02/2011    cc:   Silas E Lucas, III, MD

## 2011-09-03 NOTE — Progress Notes (Signed)
Spoke with MD Alyson Locket from office. Order to change PRN oxycodone from q12 PRN to q4 PRN. Orders entered into [facility].

## 2011-09-03 NOTE — Progress Notes (Signed)
Flexeril 10 mg PO x 1 tablet for c/o neck pain. Patient sitting up in bed watching tv. Husband and family at bedside. No other needs noted.

## 2011-09-03 NOTE — Progress Notes (Signed)
PCA discontinued. Pump and tubing delivered to pharmacy for proper wastage.

## 2011-09-03 NOTE — Progress Notes (Signed)
Problem: Mobility Impaired (Adult and Pediatric)  Goal: *Acute Goals and Plan of Care (Insert Text)  (1.)Ms. Ernster will move from supine to sit and sit to supine , scoot up and down and roll side to side in bed with INDEPENDENT within 7 day(s).   (2.)Ms. Perrone will transfer from bed to chair and chair to bed with MODIFIED INDEPENDENCE using the least restrictive device within 7 day(s).   (3.)Ms. Mezo will ambulate with MODIFIED INDEPENDENCE for 500 feet with the least restrictive device within 7 day(s).  (4.)Ms. Desch will participate in BLE therapy exercises x 15 reps to increase strength for safety and independence with ADL???s and functional mobility within 7 days.  (5.)Ms. Splinter will ascend/descend 4 stairs with bilateral rails with MODIFIED INDEPENDENCE to be able to get in/out of her house within 7 days.   ________________________________________________________________________________________________  PHYSICAL THERAPY: Treatment Day: Day of Assessment and PM  INPATIENT    NAME/AGE/GENDER: MALEAH RABAGO is a 51 y.o. female  DATE: 09/03/2011  PRIMARY DIAGNOSIS: SPONDYLOLISTHESIS/SPINAL STENOSIS  Spinal stenosis, lumbar region, with neurogenic claudication  Procedure(s) (LRB):  SPINE TRANSFORAMINAL LUMBAR INTERBODY FUSION (TLIF) (Bilateral) 1 Day Post-Op     INTERDISCIPLINARY COLLABORATION: Physical Therapy Assistant and Registered Nurse  ASSESSMENT:   Ms. Passey is supine upon contact and agreeable to PT. Patient is feeling better this afternoon and able to increase gait distance. Patient was somewhat fatigued post ambulation but this is expected with her recent surgery. Patient is making good progress.      ????????This section established at most recent assessment??????????   PROBLEM LIST (Impairments causing functional limitations):  1. Decreased Strength affecting function   2. Decreased ADL/Functional Activities   3. Decreased Transfer Abilities   4. Decreased Ambulation Ability/Technique   5.  Decreased Balance   6. Increased Pain affecting function   REHABILITATION POTENTIAL FOR STATED GOALS: GOOD      PLAN OF CARE:   INTERVENTIONS PLANNED: (Benefits and precautions of physical therapy have been discussed with the patient.)  1. balance exercise   2. bed mobility   3. family education   4. gait training   5. home exercise program (HEP)   6. neuromuscular re-education/strengthening   7. range of motion: active/assisted/passive   8. therapeutic activities   9. therapeutic exercise/strengthening   10. transfer training   FREQUENCY/DURATION: Follow patient 1-2 times per day/4-7 days per week until goals are met in order to address above goals.    RECOMMENDED REHABILITATION/EQUIPMENT: (at time of discharge pending progress):   None.  SUBJECTIVE:   "I feel so much better"    Present Symptoms:    Pain Intensity 1: 0  Pain Location 1: Back  Pain Orientation 1: Lower;Mid  Pain Intervention(s) 1: Medication (see MAR)  History of Present Injury/Illness: s/p above procedure  Prior Level of Function/Home Situation: Pt lives with daughter. She was independent with ADL's and ambulating without a device prior to admission.   Tub or Shower Type: Shower  OBJECTIVE/TREATMENT:   (In addition to Assessment/Re-Assessment sessions the following treatments were rendered)  Most Recent Physical Functioning:   Gross Assessment:   AROM: Generally decreased, functional  Strength: Generally decreased, functional  Sensation: Impaired (decreased to light touch LLE)  Posture:  Posture (WDL): Exceptions to WDL  Posture Assessment: Forward head;Rounded shoulders  Balance:  Sitting: Intact  Standing: Impaired  Standing - Static: Good  Standing - Dynamic : Fair  Bed Mobility:  Rolling: Supervision  Supine to Sit: Supervision  Sit to  Supine: Supervision  Wheelchair Mobility:     Transfers:  Sit to Stand: Supervision  Stand to Sit: Supervision  Bed to Chair: CGA  Gait:     Base of Support: Narrowed  Speed/Cadence: Pace decreased (<100  feet/min)  Step Length: Left shortened;Right shortened  Gait Abnormalities: Trunk sway increased  Distance (ft):  (15 feet, 20 feet, 5 feet)  Assistive Device: Other (comment) (IV pole)  Ambulation - Level of Assistance: CGA;Minimal assistance  Interventions: Safety awareness training;Verbal cues      Therapeutic Activity: (    25 Minutes):  Therapeutic activities including bed mobility training, transfer training, scooting, and ambulation on level ground to improve mobility, strength and balance.  Required minimal Safety awareness training;Verbal cues to promote dynamic balance in standing and promote motor control of bilateral, lower extremity(s).     Braces/Orthotics/Lines/Etc:   ?? IV and drain and lumbar corset   Safety:   After treatment position/precautions:  ?? Supine in bed, Bed/Chair-wheels locked, Bed in low position, Call light within reach and RN notified   Progression/Medical Necessity:   ?? Patient demonstrates good rehab potential due to higher previous functional level.   Compliance with Program/Exercises: compliant all of the time, Will assess as treatment progresses.   Reason for Continuation of Services/Other Comments:  ?? Patient continues to require skilled intervention due to not functioning at baseline.   Recommendations/Intent for next treatment session: Treatment next visit will focus on advancements to more challenging activities and reduction in assistance provided.  Total Treatment Duration:  Time In: 1310  Time Out: 1335  Jessica M. Binkley, PTA

## 2011-09-03 NOTE — Progress Notes (Signed)
POST OP SPINE PROGRESS NOTE    September 03, 2011    Admit Date: 09/02/2011  Admit Diagnosis: SPONDYLOLISTHESIS/SPINAL STENOSIS  Spinal stenosis, lumbar region, with neurogenic claudication  Post Op day: 1 Day Post-Op      Subjective:     Kristin [name] is a patient who is now 1 Day Post-Op  and has no complaints.       Objective:     PT/OT:       Vital Signs:    Patient Vitals for the past 8 hrs:   BP Temp Pulse Resp SpO2   09/03/11 1158 138/78 mmHg 97.6 ??F (36.4 ??C) 66  20  100 %   09/03/11 0925 155/96 mmHg - - - -   09/03/11 0920 136/95 mmHg - - - -   09/03/11 0745 123/77 mmHg 97.6 ??F (36.4 ??C) 80  20  99 %     Temp (24hrs), Avg:97.7 ??F (36.5 ??C), Min:97.6 ??F (36.4 ??C), Max:98 ??F (36.7 ??C)      LAB:    Recent Labs   [facility] 09/03/11 0610    HGB 9.7*    WBC 15.6*    PLT 275       I/O:  07/24 0700 - 07/24 1859  In: 360 [P.O.:360]  Out: -   07/22 1900 - 07/24 0659  In: 3200 [I.V.:3200]  Out: 4000 [Urine:3300; Drains:400]    Physical Exam:    Awake and in no acute distress.  Mood and affect appropriate.  Respirations unlabored and no evidence cyanosis.  Calves nontender.  Abdomen soft and nontender.  Dressing clean/dry  No new neurologic deficit.    Assessment:      Patient Active Problem List   Diagnoses Code   ??? Spinal stenosis, lumbar region, with neurogenic claudication 724.03   ??? Acquired spondylolisthesis 738.4       1 Day Post-Op STATUS POST Procedure(s) with comments:  SPINE TRANSFORAMINAL LUMBAR INTERBODY FUSION (TLIF) - REVISION BILATERAL  L3-L4 LAMINECTOMY WITH FUSION, BONE MARROW ASPIRATION,  TLIF AND INSTRUMENTATION          Plan:     Continue PT/OT/Rehab  Discontinue: PCA  Consult: PT   Anticipate discharge to: Possibly home tomorrow.      Signed By: Myrtie Hawk, PA

## 2011-09-03 NOTE — Progress Notes (Signed)
Zofran 4 mg IVP administered for c/o nausea post ambulation. Patient working with PT. She is returned to bed after ambulation. Still needs to void. Foley catheter removed this morning. Daughter at bedside.

## 2011-09-03 NOTE — Op Note (Signed)
[facility]                            [address]                           [place], [place] [address]                                [phone]                                OPERATIVE REPORT    NAME:  Kristin [name], Kristin [name]                           MR:  [id number]  LOC:                        SEX:  F               ACCT:  [id number]  DOB:  12-Nov-1960            AGE:  51              PT:  I  ADMIT:  09/02/2011          DSCH:  09/02/2011     MSV:      DATE: 09/02/2011    PREPROCEDURE DIAGNOSIS: Prior L4-L5 instrumented fusion with subsequent  development of L3-4 spinal stenosis and spondylolisthesis.    POSTPROCEDURE DIAGNOSIS: Prior L4-L5 instrumented fusion with subsequent  development of L3-4 spinal stenosis and spondylolisthesis.    NAME OF PROCEDURE  1. Bilateral redo L3 and L4 laminectomy with medial partial facetectomy.  2. L3-4 posterolateral fusion.  3. L3-4 posterior lumbar interbody fusion.  4. Placement of biomechanical interbody device at L3-L4 using the Stryker  biomechanical implant.  5. Placement of segmental spinal instrumentation L3-L5 using the Stryker  hardware.  6. Left iliac crest bone marrow aspirate.  7. Use of Vitoss bone graft substitute for fusion.    SURGEON: Rob Bunting. Earleen Reaper, MD    ASSISTANT: Roe Rutherford, MD    ANESTHESIA: GETA.    ESTIMATED BLOOD LOSS: 200 mL.    FLUIDS: 1700 mL of crystalloid.    DRAINS: One Hemovac.    SPECIMENS: None.    INDICATIONS: The patient is a 51 year old white female who multiple years  previously had undergone an instrumented L4-L5 laminectomy, fusion,  instrumentation and done well. She subsequently had progressively  worsening pain. She does see a pain management doctor on a chronic basis,  but this pain has worsened. She was worked up and found to have spinal  stenosis and spondylolisthesis at L3-4 above her prior fusion. She failed  conservative treatment with anti-inflammatories and epidural blocks  and  was admitted for surgical intervention.    DESCRIPTION OF PROCEDURE: The patient was brought to the operating room.  After administration of anesthesia, IV antibiotics and placement of  monitoring lines, she was positioned prone on the [address] frame. Her back  was prepped with Betadine and sterilely draped. It should be noted that  an assistant was medically necessary for this case because of the  extensive soft tissue and neural retraction involved. Scalpel was used to  reenter  her midline lower back incision. This was extended cephalad. We  dissected down to the subcutaneous tissue to the deep fascia. Deep fascia  was incised on either side of the spinous processes cephalad and we  dissected out laterally, exposed the previous instrumentation. Once we  had done that, we cleaned the scar tissue and bone off from around and  over the top of the implants and then we used the Medtronic  Instruments for hardware removal to remove the locking nuts and then the rods,  and then the connectors and then the screws. The screws were measured for  length. All 4 screws were 6.5 diameter. Once we had done that, we  stripped out the lateral gutter from the L3 transverse process down to  the L4 and stripped off the soft tissue off the dorsal aspect of the  spine. Once we had done that sufficiently, we were ready to do our  decompression. We removed the spinous processes of L3 and L4 with a  rongeur, scraped scar tissue off the bony margins and then used a  Kerrison punch to perform a bilateral L3 and L4 redo laminectomy, medial  partial facetectomy with carefully retracting the neural structures to  prevent injury. Once we had done that, the thecal sac was free. There was  very thickened ligamentum flavum that was removed. We checked out through  the foramen above and below, and these were free. We cauterized epidural  veins on either side and then we elected to place our biomechanical  interbody device from the right side.  The dura was retracted towards the  midline to the left and then we incised the disk annulus with a scalpel.  The disk was very collapsed down. We used the distractors to distract and  we really only distracted to 7 mm. We used the 7 and then the 8-mm  posterior cutter to remove endplate from the posterior aspect and then we  removed disk with pituitary rongeurs. After we removed all the loose  disk, we placed a 7-mm trial from the right side and into the interbody  space. It was very tight and felt like this was the appropriate size. So  this was removed. We harvested a bone marrow aspirate from the left iliac  crest by inserting the needle into the crest, removing the stylet and  suctioning off 10 mL of bone marrow aspirate which was then placed on 10  mL of Vitoss bone graft substitute. We then packed a 7-mm biomechanical  Stryker interbody device with the Vitoss and with the neural structures  retracted, we inserted it from the right side, tamped it anteriorly, it  did not cross the midline, however, and we could not get it to go across.  We did distract through the spinous processes with the lamina spreader to  try to facilitate placement of the cage. Once this had been placed, we  thoroughly irrigated the wound multiple times, suctioned it dry. We  decorticated the transverse process of L3 as well as L4 transverse  processes and the fusion mass at L4-5 and lateral pars area bilaterally.  We then made starting holes with a starting awl at the L3 level  bilaterally and inserted the Steffee probe down through. We probed it and  no breaches were found, tapped it with a 5.5-mm tap, reprobed it and no  breaches were found. We then inserted 6.5 screws of varying lengths at  each level bilaterally and then fashioned rods to fit into the  screws  bilaterally, the Vitoss bone graft substitute had been placed in the  lateral gutters from the L3 transverse process down to L4 and along the  lateral bony margins. The rods  were placed into the screws bilaterally  top tightening, locking nuts were placed and then securely torqued tight  with the torque wrench. After we had done this, AP and lateral C-arm  x-rays were obtained, which showed good alignment of the spine, good  position and instrumentation as well as the interbody cage. The interbody  cage was still somewhat off to the right side, it was not positioned in  the midline, but it was well anterior and felt to be very stable. At this  point, satisfied with the x-rays, we were ready to close the wound. A  Hemovac drain was placed deep in the wound and brought out through a  separate proximal stab incision. The deep fascia was then reapproximated  with interrupted figure-of-eight stitches of #1 Vicryl. Subcutaneous  tissue was closed over this with interrupted stitches of 2-0 Vicryl and  the skin was closed with a running subcuticular stitch of 3-0 Vicryl.  Steri-Strips and dry sterile dressings were applied. The patient was then  rolled supine onto the recovery room bed, having tolerated the procedure  well with no apparent complications.                Wandra Mannan, III, MD    A                This is an unverified document unless signed by physician.    TID:  [id number]                                      DT:  09/03/2011 11:47 A  JOB:  [id number]        DOC#:  [id number]           DD:  09/02/2011    cc:   Judd Lien, MD

## 2011-09-04 LAB — CBC W/O DIFF
HCT: 29.3 % — ABNORMAL LOW (ref 35.8–46.3)
HGB: 9.5 g/dL — ABNORMAL LOW (ref 11.7–15.4)
MCH: 29.4 PG (ref 26.1–32.9)
MCHC: 32.4 g/dL (ref 31.4–35.0)
MCV: 90.7 FL (ref 79.6–97.8)
MPV: 11.1 FL (ref 10.8–14.1)
PLATELET: 274 10*3/uL (ref 150–450)
RBC: 3.23 M/uL — ABNORMAL LOW (ref 4.05–5.25)
RDW: 14.5 % (ref 11.9–14.6)
WBC: 10 10*3/uL (ref 4.3–11.1)

## 2011-09-04 MED ORDER — OXYCODONE 10 MG TAB
10 mg | ORAL_TABLET | ORAL | Status: DC | PRN
Start: 2011-09-04 — End: 2019-04-04

## 2011-09-04 MED ADMIN — oxyCODONE IR (ROXICODONE) tablet 10 mg: ORAL | @ 12:00:00 | NDC 00406055223

## 2011-09-04 MED ADMIN — oxyCODONE IR (ROXICODONE) tablet 10 mg: ORAL | @ 03:00:00 | NDC 00406055262

## 2011-09-04 MED ADMIN — docusate sodium (COLACE) capsule 100 mg: ORAL | @ 12:00:00 | NDC 62584068311

## 2011-09-04 MED ADMIN — DULoxetine (CYMBALTA) capsule 60 mg: ORAL | @ 12:00:00 | NDC 00002327001

## 2011-09-04 MED ADMIN — sodium chloride (NS) flush 5-10 mL: INTRAVENOUS | @ 09:00:00 | NDC 87701099893

## 2011-09-04 MED ADMIN — oxyCODONE CR (OXYCONTIN) tablet 10 mg: ORAL | @ 09:00:00 | NDC 59011041020

## 2011-09-04 MED ADMIN — famotidine (PEPCID) tablet 20 mg: ORAL | @ 02:00:00 | NDC 68084017211

## 2011-09-04 MED ADMIN — acetaminophen (OFIRMEV) infusion 1,000 mg: INTRAVENOUS | @ 07:00:00 | NDC 43825010201

## 2011-09-04 MED ADMIN — zolpidem (AMBIEN) tablet 5 mg: ORAL | @ 02:00:00 | NDC 00904608261

## 2011-09-04 MED ADMIN — famotidine (PEPCID) tablet 20 mg: ORAL | @ 12:00:00 | NDC 68084017211

## 2011-09-04 MED ADMIN — cyclobenzaprine (FLEXERIL) tablet 10 mg: ORAL | @ 02:00:00 | NDC 68084039711

## 2011-09-04 MED ADMIN — sodium chloride (NS) flush 5-10 mL: INTRAVENOUS | @ 02:00:00 | NDC 87701099893

## 2011-09-04 MED ADMIN — oxyCODONE IR (ROXICODONE) tablet 10 mg: ORAL | @ 17:00:00 | NDC 00406055223

## 2011-09-04 MED ADMIN — acetaminophen (OFIRMEV) infusion 1,000 mg: INTRAVENOUS | @ 02:00:00 | NDC 43825010201

## 2011-09-04 MED ADMIN — cyclobenzaprine (FLEXERIL) tablet 10 mg: ORAL | @ 18:00:00 | NDC 68084039711

## 2011-09-04 MED FILL — CYCLOBENZAPRINE 10 MG TAB: 10 mg | ORAL | Qty: 1

## 2011-09-04 MED FILL — DOCUSATE SODIUM 100 MG CAP: 100 mg | ORAL | Qty: 1

## 2011-09-04 MED FILL — OXYCODONE 5 MG TAB: 5 mg | ORAL | Qty: 2

## 2011-09-04 MED FILL — OFIRMEV 1,000 MG/100 ML (10 MG/ML) INTRAVENOUS SOLUTION: 1000 mg/100 mL (10 mg/mL) | INTRAVENOUS | Qty: 100

## 2011-09-04 MED FILL — FAMOTIDINE 20 MG TAB: 20 mg | ORAL | Qty: 1

## 2011-09-04 MED FILL — ZOLPIDEM 5 MG TAB: 5 mg | ORAL | Qty: 1

## 2011-09-04 MED FILL — CYMBALTA 60 MG CAPSULE,DELAYED RELEASE: 60 mg | ORAL | Qty: 1

## 2011-09-04 MED FILL — OXYCONTIN 10 MG TABLET,EXTENDED RELEASE: 10 mg | ORAL | Qty: 1

## 2011-09-04 NOTE — Progress Notes (Signed)
POST OP SPINE PROGRESS NOTE    September 04, 2011    Admit Date: 09/02/2011  Admit Diagnosis: SPONDYLOLISTHESIS/SPINAL STENOSIS  Spinal stenosis, lumbar region, with neurogenic claudication  Post Op day: 2 Days Post-Op      Subjective:     Kristin [name] is a patient who is now 2 Days Post-Op  and has no complaints.       Objective:     PT/OT:       Vital Signs:    Patient Vitals for the past 8 hrs:   BP Temp Pulse Resp SpO2   09/04/11 0738 151/84 mmHg 98.1 ??F (36.7 ??C) 81  16  97 %   09/04/11 0458 138/74 mmHg 98 ??F (36.7 ??C) 80  16  92 %     Temp (24hrs), Avg:97.9 ??F (36.6 ??C), Min:97.2 ??F (36.2 ??C), Max:98.3 ??F (36.8 ??C)      LAB:    Recent Labs   [facility] 09/04/11 0508    HGB 9.5*    WBC 10.0    PLT 274       I/O:     07/23 1900 - 07/25 0659  In: 2959 [P.O.:720; I.V.:2239]  Out: 2985 [Urine:2500; Drains:485]    Physical Exam:    Awake and in no acute distress.  Mood and affect appropriate.  Respirations unlabored and no evidence cyanosis.  Calves nontender.  Abdomen soft and nontender.  Dressing dressing changed  No new neurologic deficit.    Assessment:      Patient Active Problem List   Diagnoses Code   ??? Spinal stenosis, lumbar region, with neurogenic claudication 724.03   ??? Acquired spondylolisthesis 738.4       2 Days Post-Op STATUS POST Procedure(s) with comments:  SPINE TRANSFORAMINAL LUMBAR INTERBODY FUSION (TLIF) - REVISION BILATERAL  L3-L4 LAMINECTOMY WITH FUSION, BONE MARROW ASPIRATION,  TLIF AND INSTRUMENTATION          Plan:     Continue PT/OT/Rehab  Discontinue: drain this am  Consult: PT   Anticipate discharge to: Home today, patient will need a prescription for oxycodone.  Will D/W Dr. Samuel Bouche.      Signed By: Myrtie Hawk, PA

## 2011-09-04 NOTE — Progress Notes (Addendum)
Problem: Mobility Impaired (Adult and Pediatric)  Goal: *Acute Goals and Plan of Care (Insert Text)  (1.)Ms. Yerby will move from supine to sit and sit to supine , scoot up and down and roll side to side in bed with INDEPENDENT within 7 day(s).   (2.)Ms. Malter will transfer from bed to chair and chair to bed with MODIFIED INDEPENDENCE using the least restrictive device within 7 day(s).   (3.)Ms. Nephew will ambulate with MODIFIED INDEPENDENCE for 500 feet with the least restrictive device within 7 day(s).  (4.)Ms. Gisler will participate in BLE therapy exercises x 15 reps to increase strength for safety and independence with ADL???s and functional mobility within 7 days.  (5.)Ms. Pickney will ascend/descend 4 stairs with bilateral rails with MODIFIED INDEPENDENCE to be able to get in/out of her house within 7 days.   ________________________________________________________________________________________________  PHYSICAL THERAPY: Daily Note, Treatment Day: 1st and AM  INPATIENT    NAME/AGE/GENDER: Kristin [name] is a 51 y.o. female  DATE: 09/04/2011  PRIMARY DIAGNOSIS: SPONDYLOLISTHESIS/SPINAL STENOSIS  Spinal stenosis, lumbar region, with neurogenic claudication  Procedure(s) (LRB):  SPINE TRANSFORAMINAL LUMBAR INTERBODY FUSION (TLIF) (Bilateral) 2 Days Post-Op     INTERDISCIPLINARY COLLABORATION: Physical Therapy Assistant and Registered Nurse  ASSESSMENT:   Ms. Spearman is supine upon contact and agreeable to PT. Patient is performing at the mod I-supervision level. Patient requires assistance with bathroom duties and with donning brace. Her daughter will be able to assist her with this at home. Patient reports increased pain through her RLE. Otherwise patient is feeling good and able to tolerate stair training and ambulation. Good progress. Reviewed spinal precautions and discharge instructions once home.      ????????This section established at most recent assessment??????????   PROBLEM LIST (Impairments  causing functional limitations):  1. Decreased Strength affecting function   2. Decreased ADL/Functional Activities   3. Decreased Transfer Abilities   4. Decreased Ambulation Ability/Technique   5. Decreased Balance   6. Increased Pain affecting function   REHABILITATION POTENTIAL FOR STATED GOALS: GOOD      PLAN OF CARE:   INTERVENTIONS PLANNED: (Benefits and precautions of physical therapy have been discussed with the patient.)  1. balance exercise   2. bed mobility   3. family education   4. gait training   5. home exercise program (HEP)   6. neuromuscular re-education/strengthening   7. range of motion: active/assisted/passive   8. therapeutic activities   9. therapeutic exercise/strengthening   10. transfer training   FREQUENCY/DURATION: Follow patient 1-2 times per day/4-7 days per week until goals are met in order to address above goals.    RECOMMENDED REHABILITATION/EQUIPMENT: (at time of discharge pending progress):   None.  SUBJECTIVE:   "My leg is hurting"    Present Symptoms:    Pain Intensity 1: 2  Pain Location 1: Leg  Pain Orientation 1: Lower  Pain Intervention(s) 1: Medication (see MAR)  History of Present Injury/Illness: s/p above procedure  Prior Level of Function/Home Situation: Pt lives with daughter. She was independent with ADL's and ambulating without a device prior to admission.   Tub or Shower Type: Shower  OBJECTIVE/TREATMENT:   (In addition to Assessment/Re-Assessment sessions the following treatments were rendered)  Most Recent Physical Functioning:   Gross Assessment:   AROM: Generally decreased, functional  Strength: Generally decreased, functional  Sensation: Impaired (decreased to light touch LLE)  Posture:  Posture (WDL): Exceptions to WDL  Posture Assessment: Forward head;Rounded shoulders  Balance:  Sitting: Intact  Standing: Impaired  Standing - Static: Good  Standing - Dynamic : Fair  Bed Mobility:  Rolling: Modified independence, requires equipment  Supine to Sit: Modified  independence, requires equipment  Sit to Supine: Modified independence, requires equipment  Wheelchair Mobility:     Transfers:  Sit to Stand: Supervision  Stand to Sit: Supervision  Gait:     Base of Support: Narrowed  Speed/Cadence: Pace decreased (<100 feet/min);Slow  Step Length: Left shortened;Right shortened  Gait Abnormalities: Trunk sway increased  Distance (ft): 300 Feet (ft)  Assistive Device:  (none)  Ambulation - Level of Assistance: CGA;SBA  Number of Stairs Trained: 4   Stairs - Level of Assistance: Stand-by assistance  Rail Use: Both  Interventions: Safety awareness training;Verbal cues      Therapeutic Activity: (    23 Minutes):  Therapeutic activities including bed mobility training, toilet transfer/bathroom duties, transfer training, scooting, stair training, and ambulation on level ground to improve mobility, strength and balance.  Required minimal Safety awareness training;Verbal cues to promote dynamic balance in standing and promote motor control of bilateral, lower extremity(s).     Braces/Orthotics/Lines/Etc:   ?? None   Safety:   After treatment position/precautions:  ?? Supine in bed, Bed/Chair-wheels locked, Bed in low position, Call light within reach, RN notified and Family at bedside   Progression/Medical Necessity:   ?? Patient demonstrates good rehab potential due to higher previous functional level.   Compliance with Program/Exercises: compliant all of the time, Will assess as treatment progresses.   Reason for Continuation of Services/Other Comments:  ?? Patient continues to require skilled intervention due to not functioning at baseline.   Recommendations/Intent for next treatment session: Treatment next visit will focus on advancements to more challenging activities and reduction in assistance provided.  Total Treatment Duration:  Time In: 0852  Time Out: 0915  Anderson Malta. Binkley, PTA

## 2011-09-04 NOTE — Progress Notes (Signed)
Discharge instructions, follow up appt, and prescription reviewed with pt and daughter.  Pt medicated with Roxicodone and will wait at least an hour to leave.

## 2011-09-06 NOTE — Discharge Summary (Signed)
[facility]  [facility]                            [address]                           [place], [place] 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    OPERATIONS PERFORMED  1. Bilateral redo L3 and L4 laminectomy L3-4 posterolateral fusion.  2. L3-4 interbody fusion.  3. Placement of biomechanical interbody device at L3-4.  4. Segmental spinal instrumentation placement with bone marrow aspirate.    HISTORY OF PRESENT ILLNESS: The patient is a 51 year old white female who  had previously undergone an L4-L5 laminectomy and fusion in the past and  done well, but she has always had chronic pain and has been seeing pain  management, but been stable, but over the last year has had progressively  worsening back and leg pain. She was worked up and found to have a new  spondylolisthesis at L3-4 above her fusion with stenosis. She failed to  get better with therapy, anti-inflammatories, pain medication and  epidural blocks, so she was admitted for surgical treatment.    HOSPITAL COURSE: The patient was admitted 09/02/2011, taken to the  operating room where she underwent her surgery. She tolerated this well.  Postoperatively, she was taken to the floor where her course has been  unremarkable. She has remained afebrile with stable vital signs. She has  remained neurologically intact. Her hemoglobin has stabilized at a level  of 9.5. She has been able to tolerate a regular diet. She has been able  to void after removal of her Foley catheter and she had her Hemovac drain  removed on  postoperative day #2 because of a notable decrease in the  amount of drainage. Her dressings remained dry. She has remained  neurologically intact. She has been seen by therapy and ambulated in the  halls and by postoperative day #2, she had completed her therapy goals  and gone up and down the stairs. She is wearing her brace and she should  wear that whenever she is out of bed.    DISCHARGE PLAN:  So at this point, it is  felt that she is ready for  discharge home, so we will discharge her to home. She should leave her  dressing in place for 6 days and then she can remove it. She can take a  shower, but not a tub bath. She has Opana immediate and slow release  medication at home. We have added oxycodone 10 mg for breakthrough pain  and we will give her a prescription for 10 mg 120 tablets 1 q.4h. p.r.n.  She will wear a brace whenever she is out of bed. She will follow up in  the office in 2 weeks' time. She will call if there is a problem.                Wandra Mannan, III, MD                This is an unverified document unless signed by physician.    TID: [id number]                                       DT: 09/06/2011  9:21 A  JOB: [id number]         DOC#: [id number]            DD: 09/04/2011    cc:   Judd Lien, MD

## 2013-02-18 DIAGNOSIS — K5900 Constipation, unspecified: Secondary | ICD-10-CM | POA: Diagnosis not present

## 2013-02-18 DIAGNOSIS — K648 Other hemorrhoids: Secondary | ICD-10-CM | POA: Diagnosis not present

## 2013-02-18 DIAGNOSIS — D509 Iron deficiency anemia, unspecified: Secondary | ICD-10-CM | POA: Diagnosis not present

## 2013-02-18 DIAGNOSIS — K5730 Diverticulosis of large intestine without perforation or abscess without bleeding: Secondary | ICD-10-CM | POA: Diagnosis not present

## 2013-02-18 DIAGNOSIS — D500 Iron deficiency anemia secondary to blood loss (chronic): Secondary | ICD-10-CM | POA: Diagnosis not present

## 2013-06-08 DIAGNOSIS — L259 Unspecified contact dermatitis, unspecified cause: Secondary | ICD-10-CM | POA: Diagnosis not present

## 2014-02-15 DIAGNOSIS — M7072 Other bursitis of hip, left hip: Secondary | ICD-10-CM | POA: Diagnosis not present

## 2014-02-15 DIAGNOSIS — M7071 Other bursitis of hip, right hip: Secondary | ICD-10-CM | POA: Diagnosis not present

## 2014-04-18 ENCOUNTER — Other Ambulatory Visit: Payer: Self-pay | Admitting: Orthopedic Surgery

## 2014-04-18 DIAGNOSIS — M7989 Other specified soft tissue disorders: Secondary | ICD-10-CM

## 2014-04-27 ENCOUNTER — Ambulatory Visit
Admission: RE | Admit: 2014-04-27 | Discharge: 2014-04-27 | Disposition: A | Discharge status: Home / Self Care | Payer: Worker's Compensation | Source: Ambulatory Visit | Attending: Orthopedic Surgery | Admitting: Orthopedic Surgery

## 2014-04-27 DIAGNOSIS — M779 Enthesopathy, unspecified: Secondary | ICD-10-CM

## 2014-04-27 DIAGNOSIS — M7989 Other specified soft tissue disorders: Secondary | ICD-10-CM

## 2014-04-27 MED ORDER — GADOBENATE DIMEGLUMINE 529 MG/ML IV SOLN
16.0000 mL | Freq: Once | INTRAVENOUS | Status: AC | PRN
  Administered 2014-04-27: 16 mL via INTRAVENOUS

## 2014-05-17 DIAGNOSIS — M25552 Pain in left hip: Secondary | ICD-10-CM | POA: Diagnosis not present

## 2014-05-17 DIAGNOSIS — M25551 Pain in right hip: Secondary | ICD-10-CM | POA: Diagnosis not present

## 2014-05-17 DIAGNOSIS — M7072 Other bursitis of hip, left hip: Secondary | ICD-10-CM | POA: Diagnosis not present

## 2014-06-06 DIAGNOSIS — M25552 Pain in left hip: Secondary | ICD-10-CM | POA: Diagnosis not present

## 2014-06-06 DIAGNOSIS — R262 Difficulty in walking, not elsewhere classified: Secondary | ICD-10-CM | POA: Diagnosis not present

## 2014-06-06 DIAGNOSIS — M25551 Pain in right hip: Secondary | ICD-10-CM | POA: Diagnosis not present

## 2014-06-09 DIAGNOSIS — M25552 Pain in left hip: Secondary | ICD-10-CM | POA: Diagnosis not present

## 2014-06-09 DIAGNOSIS — R262 Difficulty in walking, not elsewhere classified: Secondary | ICD-10-CM | POA: Diagnosis not present

## 2014-06-09 DIAGNOSIS — M25551 Pain in right hip: Secondary | ICD-10-CM | POA: Diagnosis not present

## 2014-06-13 DIAGNOSIS — M25551 Pain in right hip: Secondary | ICD-10-CM | POA: Diagnosis not present

## 2014-06-13 DIAGNOSIS — R262 Difficulty in walking, not elsewhere classified: Secondary | ICD-10-CM | POA: Diagnosis not present

## 2014-06-13 DIAGNOSIS — M25552 Pain in left hip: Secondary | ICD-10-CM | POA: Diagnosis not present

## 2014-06-16 DIAGNOSIS — R262 Difficulty in walking, not elsewhere classified: Secondary | ICD-10-CM | POA: Diagnosis not present

## 2014-06-16 DIAGNOSIS — M25551 Pain in right hip: Secondary | ICD-10-CM | POA: Diagnosis not present

## 2014-06-16 DIAGNOSIS — M25552 Pain in left hip: Secondary | ICD-10-CM | POA: Diagnosis not present

## 2015-03-27 DIAGNOSIS — Z6832 Body mass index (BMI) 32.0-32.9, adult: Secondary | ICD-10-CM | POA: Diagnosis not present

## 2015-03-27 DIAGNOSIS — E663 Overweight: Secondary | ICD-10-CM | POA: Diagnosis not present

## 2015-03-27 DIAGNOSIS — K5909 Other constipation: Secondary | ICD-10-CM | POA: Diagnosis not present

## 2015-03-27 DIAGNOSIS — N958 Other specified menopausal and perimenopausal disorders: Secondary | ICD-10-CM | POA: Diagnosis not present

## 2015-03-27 DIAGNOSIS — R030 Elevated blood-pressure reading, without diagnosis of hypertension: Secondary | ICD-10-CM | POA: Diagnosis not present

## 2015-03-27 DIAGNOSIS — N951 Menopausal and female climacteric states: Secondary | ICD-10-CM | POA: Diagnosis not present

## 2015-03-27 DIAGNOSIS — N907 Vulvar cyst: Secondary | ICD-10-CM | POA: Diagnosis not present

## 2015-04-27 DIAGNOSIS — J018 Other acute sinusitis: Secondary | ICD-10-CM | POA: Diagnosis not present

## 2015-04-27 DIAGNOSIS — J208 Acute bronchitis due to other specified organisms: Secondary | ICD-10-CM | POA: Diagnosis not present

## 2015-05-07 DIAGNOSIS — Z124 Encounter for screening for malignant neoplasm of cervix: Secondary | ICD-10-CM | POA: Diagnosis not present

## 2015-05-07 DIAGNOSIS — Z1211 Encounter for screening for malignant neoplasm of colon: Secondary | ICD-10-CM | POA: Diagnosis not present

## 2015-05-07 DIAGNOSIS — Z1239 Encounter for other screening for malignant neoplasm of breast: Secondary | ICD-10-CM | POA: Diagnosis not present

## 2015-05-07 DIAGNOSIS — K648 Other hemorrhoids: Secondary | ICD-10-CM | POA: Diagnosis not present

## 2015-05-07 DIAGNOSIS — Z01419 Encounter for gynecological examination (general) (routine) without abnormal findings: Secondary | ICD-10-CM | POA: Diagnosis not present

## 2015-05-07 DIAGNOSIS — K5909 Other constipation: Secondary | ICD-10-CM | POA: Diagnosis not present

## 2015-05-07 DIAGNOSIS — R87610 Atypical squamous cells of undetermined significance on cytologic smear of cervix (ASC-US): Secondary | ICD-10-CM | POA: Diagnosis not present

## 2015-05-07 DIAGNOSIS — Z0001 Encounter for general adult medical examination with abnormal findings: Secondary | ICD-10-CM | POA: Diagnosis not present

## 2015-05-14 DIAGNOSIS — J208 Acute bronchitis due to other specified organisms: Secondary | ICD-10-CM | POA: Diagnosis not present

## 2015-05-14 DIAGNOSIS — J989 Respiratory disorder, unspecified: Secondary | ICD-10-CM | POA: Diagnosis not present

## 2015-05-14 DIAGNOSIS — R05 Cough: Secondary | ICD-10-CM | POA: Diagnosis not present

## 2015-05-14 DIAGNOSIS — J018 Other acute sinusitis: Secondary | ICD-10-CM | POA: Diagnosis not present

## 2015-05-21 DIAGNOSIS — J208 Acute bronchitis due to other specified organisms: Secondary | ICD-10-CM | POA: Diagnosis not present

## 2015-07-08 DIAGNOSIS — S81812A Laceration without foreign body, left lower leg, initial encounter: Secondary | ICD-10-CM | POA: Diagnosis not present

## 2015-07-08 DIAGNOSIS — Z23 Encounter for immunization: Secondary | ICD-10-CM | POA: Diagnosis not present

## 2015-10-19 DIAGNOSIS — H60312 Diffuse otitis externa, left ear: Secondary | ICD-10-CM | POA: Diagnosis not present

## 2015-10-23 DIAGNOSIS — H60312 Diffuse otitis externa, left ear: Secondary | ICD-10-CM | POA: Diagnosis not present

## 2017-01-15 IMAGING — MR MR LUMBAR SPINE WO/W CM
8 series · 48 of 48 positions shown · IV contrast (multihance)
Comparison: None.

CLINICAL DATA: Low back pain. BILATERAL leg pain. Previous lumbar
surgery most recent in 7131. BILATERAL leg weakness.

EXAM:
MRI LUMBAR SPINE WITHOUT AND WITH CONTRAST
TECHNIQUE: Multiplanar and multiecho pulse sequences of the lumbar spine were
obtained without and with intravenous contrast.
CONTRAST:  16mL MULTIHANCE GADOBENATE DIMEGLUMINE 529 MG/ML IV SOLN

[Series 3: T2 · sagittal · 4.0mm · 0.88mm/px · 5 of 13 slices shown (1 of 2)]
[im 1/13]
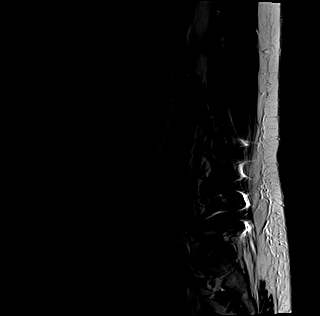
[im 4/13]
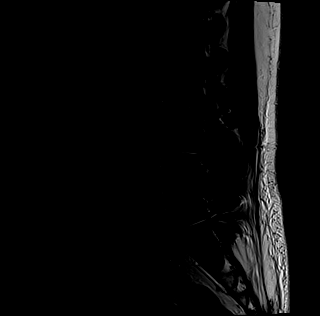
[im 7/13]
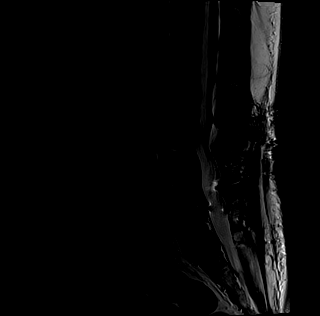
[im 10/13]
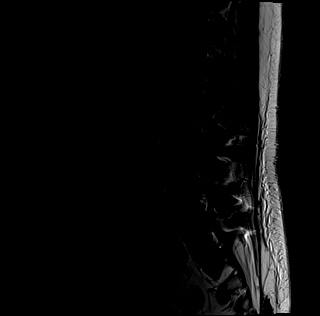
[im 13/13]
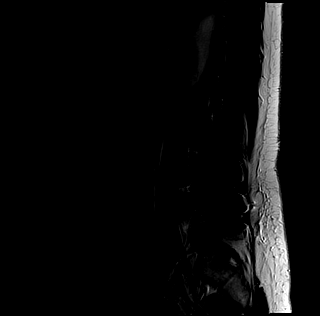

[Series 4: tirm sag · sagittal · 4.0mm · 0.55mm/px · 4 of 13 slices shown]
[im 1/13]
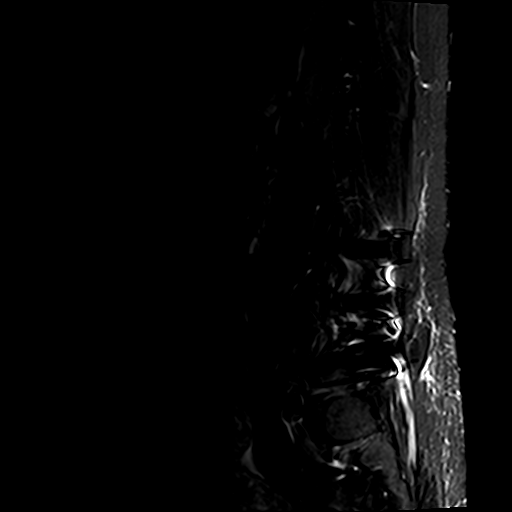
[im 5/13]
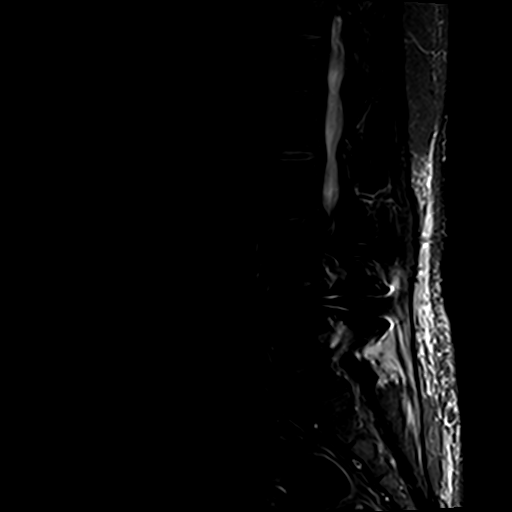
[im 9/13]
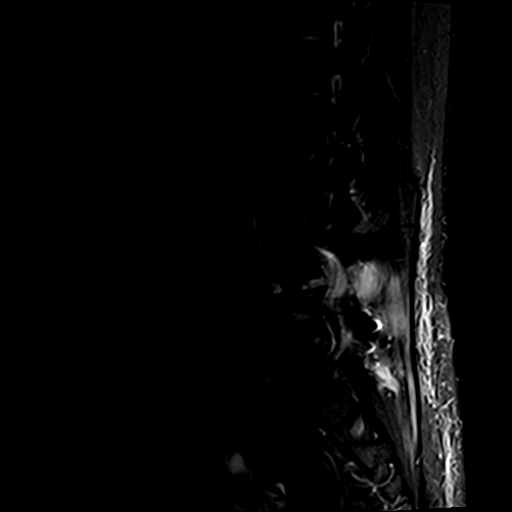
[im 13/13]
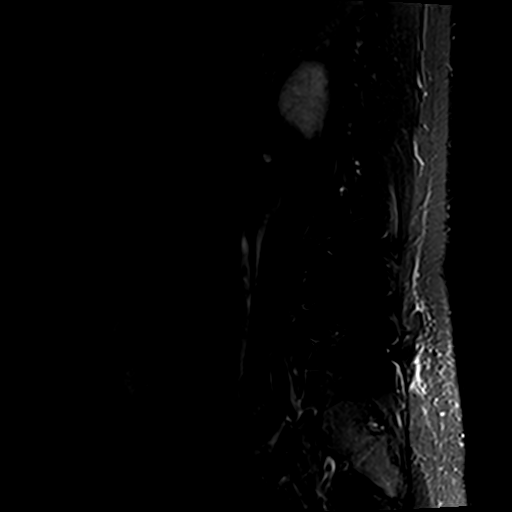

[Series 5: T1 · sagittal · 4.0mm · 0.88mm/px · 4 of 13 slices shown (1 of 2)]
[im 1/13]
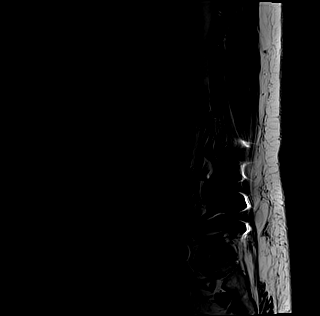
[im 5/13]
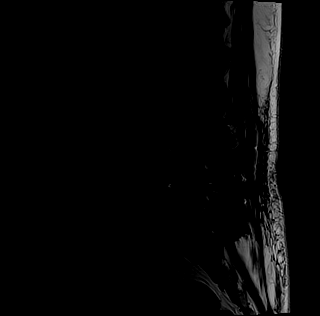
[im 9/13]
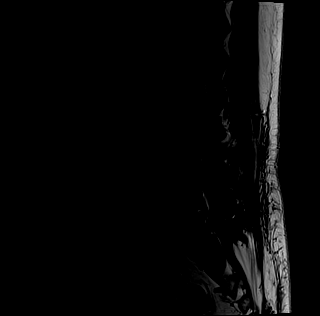
[im 13/13]
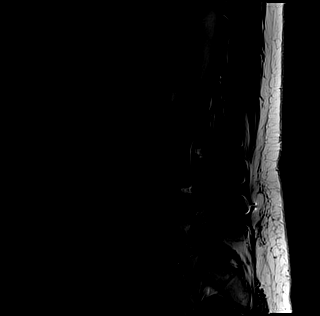

[Series 6: T1 · axial · 4.0mm · 0.70mm/px · z∈[-39,+125]mm · 9 of 29 slices shown (2 of 2)]
[im 1/29]
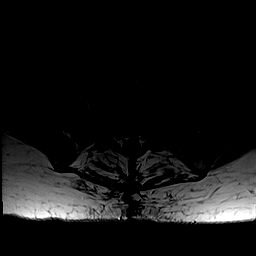
[im 4/29]
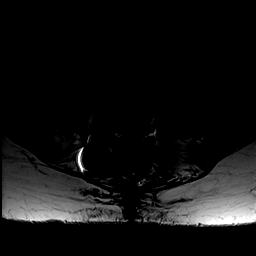
[im 8/29]
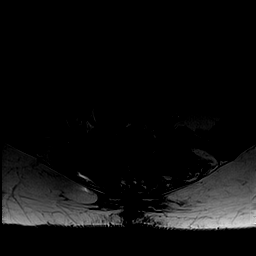
[im 11/29]
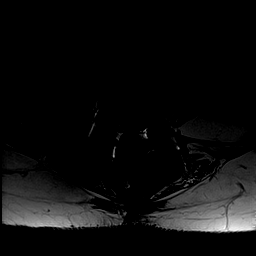
[im 15/29]
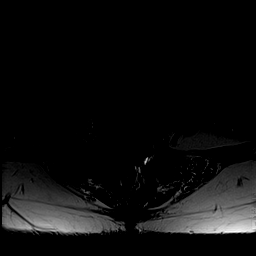
[im 18/29]
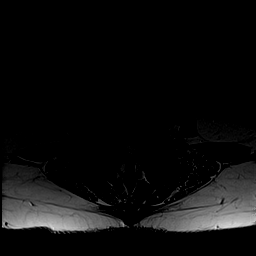
[im 22/29]
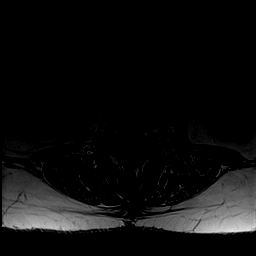
[im 25/29]
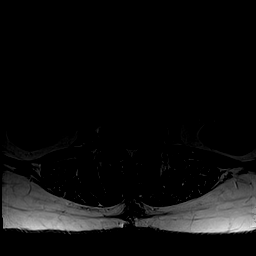
[im 29/29]
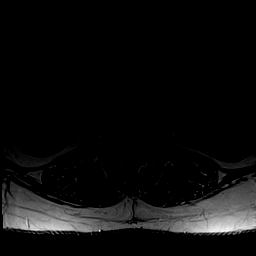

[Series 7: T2 · axial · 4.0mm · 0.70mm/px · z∈[-39,+125]mm · 9 of 29 slices shown (2 of 2)]
[im 1/29]
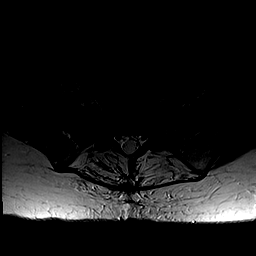
[im 4/29]
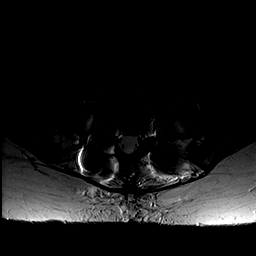
[im 8/29]
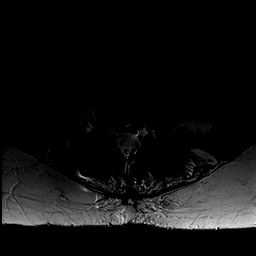
[im 11/29]
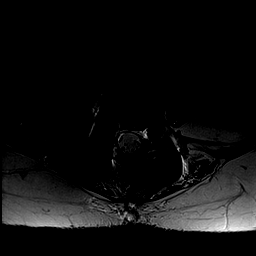
[im 15/29]
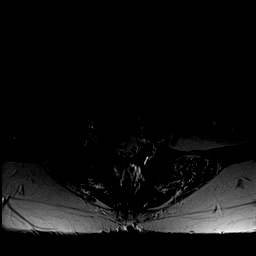
[im 18/29]
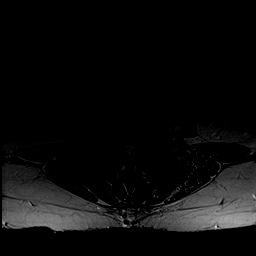
[im 22/29]
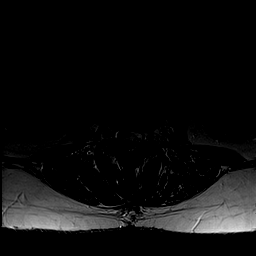
[im 25/29]
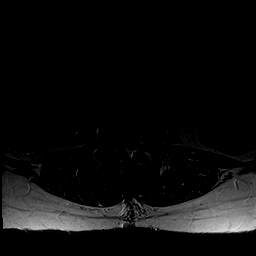
[im 29/29]
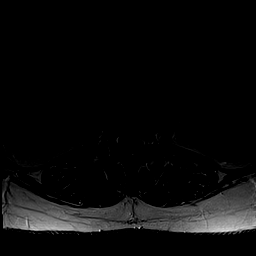

[Series 8: T1 fat-sat · sagittal · 4.0mm · 0.88mm/px · 4 of 13 slices shown]
[im 1/13]
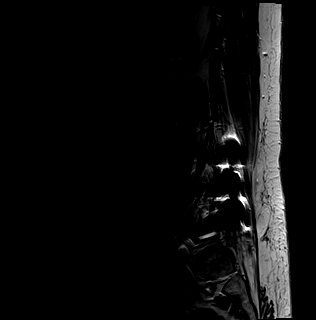
[im 5/13]
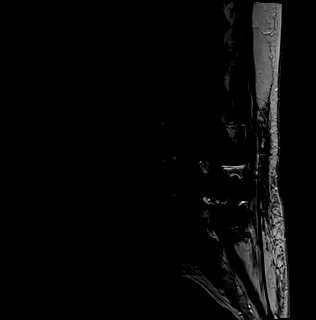
[im 9/13]
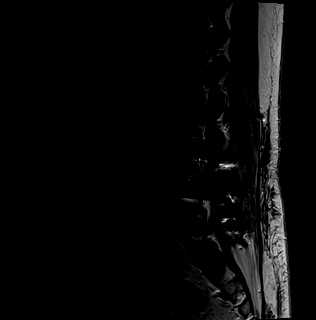
[im 13/13]
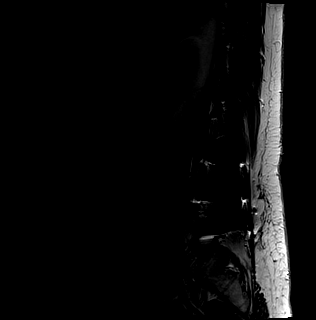

[Series 9: T1 fat-sat post-contrast · sagittal · 4.0mm · 0.88mm/px · 4 of 13 slices shown]
[im 1/13]
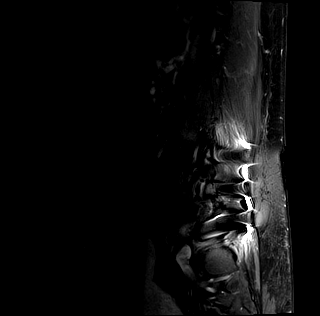
[im 5/13]
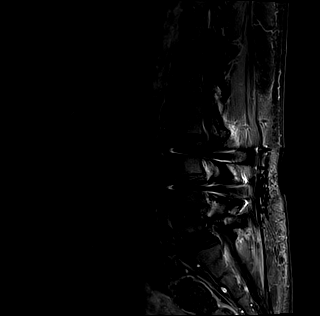
[im 9/13]
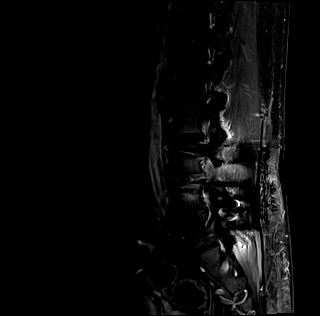
[im 13/13]
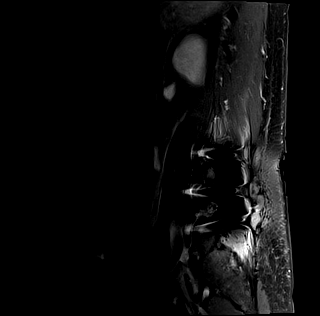

[Series 10: T1 post-contrast · axial · 4.0mm · 0.74mm/px · z∈[-40,+125]mm · 9 of 29 slices shown]
[im 1/29]
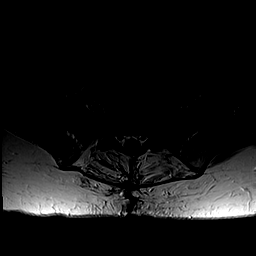
[im 4/29]
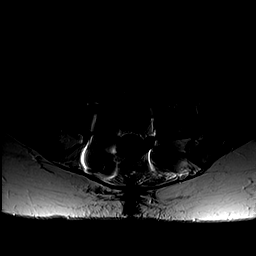
[im 8/29]
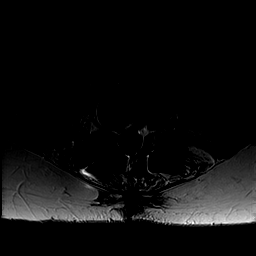
[im 11/29]
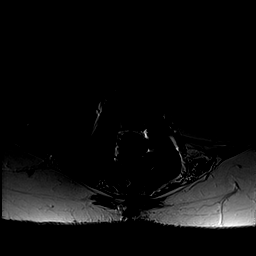
[im 15/29]
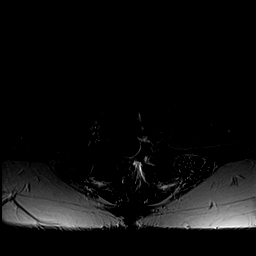
[im 18/29]
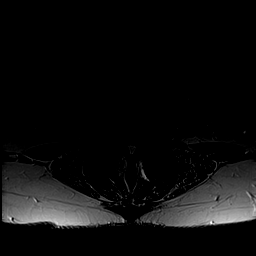
[im 22/29]
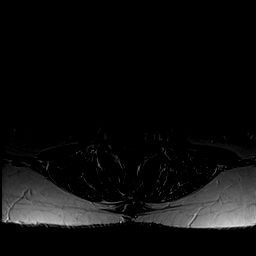
[im 25/29]
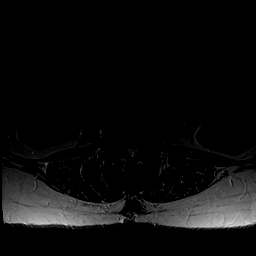
[im 29/29]
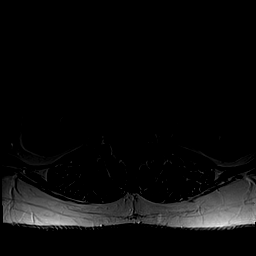

[48 of 48 positions shown; findings below may reference images not displayed]

FINDINGS: Segmentation: Normal.

Alignment:  Normal.

Vertebrae: No worrisome osseous lesion.

Conus medullaris: Normal in size, signal, and location.

Paraspinal tissues: No evidence for hydronephrosis or paravertebral
mass.

Disc levels:

L1-L2:  Normal disc space.  Mild facet arthropathy.

L2-L3: Mild annular bulging. Moderate facet arthropathy representing
adjacent segment disease. No impingement.

L3-L4:  Satisfactory post fusion appearance.

L4-L5:  Satisfactory post fusion appearance.

L5-S1:  Hypoplastic or narrowed disc space.  No impingement.

Post infusion, no abnormal enhancement.
IMPRESSION: Post fusion changes L3 through L5.  No residual impingement.

Mild bulge with moderate facet arthropathy L2-L3 without
subarticular zone or foraminal zone narrowing.

Narrowed L5-S1 disc space.  No impingement at this level.

## 2018-04-09 ENCOUNTER — Encounter: Payer: Self-pay | Admitting: Gastroenterology

## 2018-08-27 DIAGNOSIS — G894 Chronic pain syndrome: Secondary | ICD-10-CM | POA: Diagnosis not present

## 2018-08-27 DIAGNOSIS — M545 Low back pain: Secondary | ICD-10-CM | POA: Diagnosis not present

## 2018-09-06 DIAGNOSIS — M545 Low back pain: Secondary | ICD-10-CM | POA: Diagnosis not present

## 2018-09-06 DIAGNOSIS — M47816 Spondylosis without myelopathy or radiculopathy, lumbar region: Secondary | ICD-10-CM | POA: Diagnosis not present

## 2018-10-12 DIAGNOSIS — H25813 Combined forms of age-related cataract, bilateral: Secondary | ICD-10-CM | POA: Diagnosis not present

## 2018-10-20 DIAGNOSIS — H2512 Age-related nuclear cataract, left eye: Secondary | ICD-10-CM | POA: Diagnosis not present

## 2018-10-20 DIAGNOSIS — Z01818 Encounter for other preprocedural examination: Secondary | ICD-10-CM | POA: Diagnosis not present

## 2018-10-27 DIAGNOSIS — H25812 Combined forms of age-related cataract, left eye: Secondary | ICD-10-CM | POA: Diagnosis not present

## 2018-10-27 DIAGNOSIS — H2512 Age-related nuclear cataract, left eye: Secondary | ICD-10-CM | POA: Diagnosis not present

## 2018-11-08 DIAGNOSIS — H2511 Age-related nuclear cataract, right eye: Secondary | ICD-10-CM | POA: Diagnosis not present

## 2018-11-08 DIAGNOSIS — H25811 Combined forms of age-related cataract, right eye: Secondary | ICD-10-CM | POA: Diagnosis not present

## 2018-11-22 DIAGNOSIS — R21 Rash and other nonspecific skin eruption: Secondary | ICD-10-CM | POA: Diagnosis not present

## 2019-01-05 DIAGNOSIS — R42 Dizziness and giddiness: Secondary | ICD-10-CM | POA: Diagnosis not present

## 2019-03-11 DIAGNOSIS — M4802 Spinal stenosis, cervical region: Secondary | ICD-10-CM | POA: Diagnosis not present

## 2019-03-11 DIAGNOSIS — M5020 Other cervical disc displacement, unspecified cervical region: Secondary | ICD-10-CM | POA: Diagnosis not present

## 2019-03-17 DIAGNOSIS — M5020 Other cervical disc displacement, unspecified cervical region: Secondary | ICD-10-CM | POA: Diagnosis not present

## 2019-03-17 DIAGNOSIS — M4802 Spinal stenosis, cervical region: Secondary | ICD-10-CM | POA: Diagnosis not present

## 2019-03-17 DIAGNOSIS — M47812 Spondylosis without myelopathy or radiculopathy, cervical region: Secondary | ICD-10-CM | POA: Diagnosis not present

## 2019-03-18 DIAGNOSIS — M5020 Other cervical disc displacement, unspecified cervical region: Secondary | ICD-10-CM | POA: Diagnosis not present

## 2019-03-18 DIAGNOSIS — M4802 Spinal stenosis, cervical region: Secondary | ICD-10-CM | POA: Diagnosis not present

## 2019-03-22 DIAGNOSIS — M25551 Pain in right hip: Secondary | ICD-10-CM | POA: Diagnosis not present

## 2019-03-22 DIAGNOSIS — M5136 Other intervertebral disc degeneration, lumbar region: Secondary | ICD-10-CM | POA: Diagnosis not present

## 2019-03-22 DIAGNOSIS — M25552 Pain in left hip: Secondary | ICD-10-CM | POA: Diagnosis not present

## 2019-03-22 DIAGNOSIS — R296 Repeated falls: Secondary | ICD-10-CM | POA: Diagnosis not present

## 2019-03-22 DIAGNOSIS — K5903 Drug induced constipation: Secondary | ICD-10-CM | POA: Diagnosis not present

## 2019-03-22 DIAGNOSIS — Z981 Arthrodesis status: Secondary | ICD-10-CM | POA: Diagnosis not present

## 2019-03-22 DIAGNOSIS — G4700 Insomnia, unspecified: Secondary | ICD-10-CM | POA: Diagnosis not present

## 2019-03-22 DIAGNOSIS — M792 Neuralgia and neuritis, unspecified: Secondary | ICD-10-CM | POA: Diagnosis not present

## 2019-03-22 DIAGNOSIS — F418 Other specified anxiety disorders: Secondary | ICD-10-CM | POA: Diagnosis not present

## 2019-04-04 ENCOUNTER — Institutional Professional Consult (permissible substitution): Payer: MEDICARE | Primary: Family Medicine

## 2019-04-04 ENCOUNTER — Inpatient Hospital Stay: Admit: 2019-04-04 | Payer: MEDICARE | Primary: Family Medicine

## 2019-04-04 DIAGNOSIS — M5020 Other cervical disc displacement, unspecified cervical region: Secondary | ICD-10-CM | POA: Diagnosis not present

## 2019-04-04 DIAGNOSIS — Z01812 Encounter for preprocedural laboratory examination: Secondary | ICD-10-CM | POA: Diagnosis not present

## 2019-04-04 DIAGNOSIS — Z1159 Encounter for screening for other viral diseases: Secondary | ICD-10-CM | POA: Diagnosis not present

## 2019-04-04 DIAGNOSIS — Z01818 Encounter for other preprocedural examination: Secondary | ICD-10-CM | POA: Diagnosis not present

## 2019-04-04 DIAGNOSIS — M4802 Spinal stenosis, cervical region: Secondary | ICD-10-CM | POA: Diagnosis not present

## 2019-04-04 DIAGNOSIS — Z20822 Contact with and (suspected) exposure to covid-19: Secondary | ICD-10-CM | POA: Diagnosis not present

## 2019-04-04 LAB — CBC WITH AUTO DIFFERENTIAL
Basophils %: 1 % (ref 0.0–2.0)
Basophils Absolute: 0.1 10*3/uL (ref 0.0–0.2)
Eosinophils %: 2 % (ref 0.5–7.8)
Eosinophils Absolute: 0.2 10*3/uL (ref 0.0–0.8)
Granulocyte Absolute Count: 0 10*3/uL (ref 0.0–0.5)
Hematocrit: 41.3 % (ref 35.8–46.3)
Hemoglobin: 13.5 g/dL (ref 11.7–15.4)
Immature Granulocytes: 0 % (ref 0.0–5.0)
Lymphocytes %: 21 % (ref 13–44)
Lymphocytes Absolute: 1.9 10*3/uL (ref 0.5–4.6)
MCH: 29.6 PG (ref 26.1–32.9)
MCHC: 32.7 g/dL (ref 31.4–35.0)
MCV: 90.6 FL (ref 79.6–97.8)
MPV: 10.8 FL (ref 9.4–12.3)
Monocytes %: 7 % (ref 4.0–12.0)
Monocytes Absolute: 0.6 10*3/uL (ref 0.1–1.3)
NRBC Absolute: 0 10*3/uL (ref 0.0–0.2)
Neutrophils %: 69 % (ref 43–78)
Neutrophils Absolute: 5.9 10*3/uL (ref 1.7–8.2)
Platelets: 345 10*3/uL (ref 150–450)
RBC: 4.56 M/uL (ref 4.05–5.2)
RDW: 14.4 % (ref 11.9–14.6)
WBC: 8.7 10*3/uL (ref 4.3–11.1)

## 2019-04-04 LAB — URINALYSIS W/ RFLX MICROSCOPIC
Bilirubin, Urine: NEGATIVE
Bilirubin: NEGATIVE
Blood, Urine: NEGATIVE
Blood: NEGATIVE
Casts UA: 0 /lpf
Casts: 0 /lpf
Glucose, Ur: NEGATIVE mg/dL
Glucose: NEGATIVE mg/dL
Ketone: NEGATIVE mg/dL
Ketones, Urine: NEGATIVE mg/dL
Nitrite, Urine: NEGATIVE
Nitrites: NEGATIVE
Protein, UA: NEGATIVE mg/dL
Protein: NEGATIVE mg/dL
RBC, UA: 0 /hpf
RBC: 0 /hpf
Specific Gravity, UA: 1.019 (ref 1.001–1.023)
Specific gravity: 1.019 (ref 1.001–1.023)
Urobilinogen, UA, POCT: 0.2 EU/dL (ref 0.2–1.0)
Urobilinogen: 0.2 EU/dL (ref 0.2–1.0)
pH (UA): 5.5 (ref 5.0–9.0)
pH, UA: 5.5 (ref 5.0–9.0)

## 2019-04-04 LAB — BASIC METABOLIC PANEL
Anion Gap: 7 mmol/L (ref 7–16)
BUN: 14 MG/DL (ref 6–23)
CO2: 26 mmol/L (ref 21–32)
Calcium: 9 MG/DL (ref 8.3–10.4)
Chloride: 110 mmol/L — ABNORMAL HIGH (ref 98–107)
Creatinine: 0.75 MG/DL (ref 0.6–1.0)
EGFR IF NonAfrican American: >60 mL/min/{1.73_m2} (ref >60)
GFR African American: >60 mL/min/{1.73_m2} (ref >60)
Glucose: 106 mg/dL — ABNORMAL HIGH (ref 65–100)
Potassium: 3.1 mmol/L — ABNORMAL LOW (ref 3.5–5.1)
Sodium: 143 mmol/L (ref 136–145)

## 2019-04-04 LAB — MSSA/MRSA SC BY PCR, NASAL SWAB

## 2019-04-04 LAB — CBC WITH AUTOMATED DIFF
ABS. BASOPHILS: 0.1 10*3/uL (ref 0.0–0.2)
ABS. EOSINOPHILS: 0.2 10*3/uL (ref 0.0–0.8)
ABS. IMM. GRANS.: 0 10*3/uL (ref 0.0–0.5)
ABS. LYMPHOCYTES: 1.9 10*3/uL (ref 0.5–4.6)
ABS. MONOCYTES: 0.6 10*3/uL (ref 0.1–1.3)
ABS. NEUTROPHILS: 5.9 10*3/uL (ref 1.7–8.2)
ABSOLUTE NRBC: 0 10*3/uL (ref 0.0–0.2)
BASOPHILS: 1 % (ref 0.0–2.0)
EOSINOPHILS: 2 % (ref 0.5–7.8)
HCT: 41.3 % (ref 35.8–46.3)
HGB: 13.5 g/dL (ref 11.7–15.4)
IMMATURE GRANULOCYTES: 0 % (ref 0.0–5.0)
LYMPHOCYTES: 21 % (ref 13–44)
MCH: 29.6 PG (ref 26.1–32.9)
MCHC: 32.7 g/dL (ref 31.4–35.0)
MCV: 90.6 FL (ref 79.6–97.8)
MONOCYTES: 7 % (ref 4.0–12.0)
MPV: 10.8 FL (ref 9.4–12.3)
NEUTROPHILS: 69 % (ref 43–78)
PLATELET: 345 10*3/uL (ref 150–450)
RBC: 4.56 M/uL (ref 4.05–5.2)
RDW: 14.4 % (ref 11.9–14.6)
WBC: 8.7 10*3/uL (ref 4.3–11.1)

## 2019-04-04 LAB — METABOLIC PANEL, BASIC
Anion gap: 7 mmol/L (ref 7–16)
BUN: 14 MG/DL (ref 6–23)
CO2: 26 mmol/L (ref 21–32)
Calcium: 9 MG/DL (ref 8.3–10.4)
Chloride: 110 mmol/L — ABNORMAL HIGH (ref 98–107)
Creatinine: 0.75 MG/DL (ref 0.6–1.0)
GFR est AA: >60 mL/min/{1.73_m2} (ref >60)
GFR est non-AA: >60 mL/min/{1.73_m2} (ref >60)
Glucose: 106 mg/dL — ABNORMAL HIGH (ref 65–100)
Potassium: 3.1 mmol/L — ABNORMAL LOW (ref 3.5–5.1)
Sodium: 143 mmol/L (ref 136–145)

## 2019-04-04 NOTE — Other (Signed)
Recent Results (from the past 12 hour(s))   CBC WITH AUTOMATED DIFF    Collection Time: 04/04/19  1:12 PM   Result Value Ref Range    WBC 8.7 4.3 - 11.1 K/uL    RBC 4.56 4.05 - 5.2 M/uL    HGB 13.5 11.7 - 15.4 g/dL    HCT 41.3 35.8 - 46.3 %    MCV 90.6 79.6 - 97.8 FL    MCH 29.6 26.1 - 32.9 PG    MCHC 32.7 31.4 - 35.0 g/dL    RDW 14.4 11.9 - 14.6 %    PLATELET 345 150 - 450 K/uL    MPV 10.8 9.4 - 12.3 FL    ABSOLUTE NRBC 0.00 0.0 - 0.2 K/uL    DF AUTOMATED      NEUTROPHILS 69 43 - 78 %    LYMPHOCYTES 21 13 - 44 %    MONOCYTES 7 4.0 - 12.0 %    EOSINOPHILS 2 0.5 - 7.8 %    BASOPHILS 1 0.0 - 2.0 %    IMMATURE GRANULOCYTES 0 0.0 - 5.0 %    ABS. NEUTROPHILS 5.9 1.7 - 8.2 K/UL    ABS. LYMPHOCYTES 1.9 0.5 - 4.6 K/UL    ABS. MONOCYTES 0.6 0.1 - 1.3 K/UL    ABS. EOSINOPHILS 0.2 0.0 - 0.8 K/UL    ABS. BASOPHILS 0.1 0.0 - 0.2 K/UL    ABS. IMM. GRANS. 0.0 0.0 - 0.5 K/UL   METABOLIC PANEL, BASIC    Collection Time: 04/04/19  1:12 PM   Result Value Ref Range    Sodium 143 136 - 145 mmol/L    Potassium 3.1 (L) 3.5 - 5.1 mmol/L    Chloride 110 (H) 98 - 107 mmol/L    CO2 26 21 - 32 mmol/L    Anion gap 7 7 - 16 mmol/L    Glucose 106 (H) 65 - 100 mg/dL    BUN 14 6 - 23 MG/DL    Creatinine 0.75 0.6 - 1.0 MG/DL    GFR est AA >60 >60 ml/min/1.73m2    GFR est non-AA >60 >60 ml/min/1.73m2    Calcium 9.0 8.3 - 10.4 MG/DL   URINALYSIS W/ RFLX MICROSCOPIC    Collection Time: 04/04/19  1:12 PM   Result Value Ref Range    Color YELLOW      Appearance CLEAR      Specific gravity 1.019 1.001 - 1.023      pH (UA) 5.5 5.0 - 9.0      Protein Negative NEG mg/dL    Glucose Negative mg/dL    Ketone Negative NEG mg/dL    Bilirubin Negative NEG      Blood Negative NEG      Urobilinogen 0.2 0.2 - 1.0 EU/dL    Nitrites Negative NEG      Leukocyte Esterase TRACE (A) NEG      WBC 0-3 0 /hpf    RBC 0 0 /hpf    Epithelial cells 0-3 0 /hpf    Bacteria TRACE 0 /hpf    Casts 0 0 /lpf   MSSA/MRSA SC BY PCR, NASAL SWAB    Collection Time: 04/04/19  1:12 PM     Specimen: Swab   Result Value Ref Range    Special Requests: NO SPECIAL REQUESTS      Culture result:        SA target not detected.                                   A MRSA NEGATIVE, SA NEGATIVE test result does not preclude MRSA or SA nasal colonization.

## 2019-04-04 NOTE — Progress Notes (Signed)
Patient presented to the [facility] for COVID testing as ordered.  After verbal consent given by patient/caregiver,  nasal swab obtained by RN or LPN and sent to lab for processing.

## 2019-04-04 NOTE — Other (Signed)
Patient verified name, DOB, and surgery as listed in [facility]. Patient provided medical/health information and PTA medications to the best of their ability.    TYPE  CASE: II  Orders per surgeon: were received  Labs per surgeon: CBC w/ diff,BMP, UA, and MRSA.  Labs per anesthesia protocol: no additional labs required.   EKG:  Not required per anesthesia protocol    Patient COVID test date 04/04/19; Patient did show for the appointment. The testing center is located at the [facility] [address], [place]. If appointment is needed patient provided telephone number of [phone].      Nasal Swab collected per MD order.     Patient provided with and instructed on education handouts including Guide to Surgery, blood transfusions, pain management, and hand hygiene for the family and community, and [facility] brochure.     Road to Recovery Spine surgery patient guide given. Instructed on incentive spirometry with return demonstration. Patient viewed spine prehab video.     Hibiclens and instructions given per hospital policy.    Original medication prescription bottles not visualized during patient appointment.     Patient teach back successful and patient demonstrates knowledge of instruction.

## 2019-04-04 NOTE — Other (Signed)
PLEASE CONTINUE TAKING ALL PRESCRIPTION MEDICATIONS UP TO THE DAY OF SURGERY UNLESS OTHERWISE DIRECTED BELOW.    DISCONTINUE all vitamins and supplements 7 days prior to surgery. DISCONTINUE Non-Steriodal Anti-Inflammatory (NSAIDS) such as Advil and Aleve 5 days prior to surgery.     Home Medications to take  the day of surgery    Buspirone (Buspar)   Duloxetine (Cymbalta)    Nucynta if needed        Home Medications   to Hold           Comments    Covid test 04/04/19 @ 2 [address] [place], [place]       *Leisure centre manager of 1 visitor per patient discussed.       Please do not bring home medications with you on the day of surgery unless otherwise directed by your nurse.  If you are instructed to bring home medications, please give them to your nurse as they will be administered by the nursing staff.    If you have any questions, please call [address] Aline August [phone] or [address] [place] [phone].    A copy of this note was provided to the patient for reference.     How to Use Your Incentive Spirometer       About Your Incentive Spirometer  An incentive spirometer is a device that will expand your lungs by helping you to breathe more deeply and fully. The parts of your incentive spirometer are labeled in Figure 1.    Using your incentive spirometer  When you???re using your incentive spirometer, make sure to breathe through your mouth. If you breathe through your nose, the incentive spirometer won???t work properly. You can hold your nose if you have trouble. DO NOT BLOW INTO THE DEVICE. If you feel dizzy at any time, stop and rest. Try again at a later time.  1. Sit upright in a chair or in bed. Hold the incentive spirometer at eye level.   2. Put the mouthpiece in your mouth and close your lips tightly around it. Slowly breathe out (exhale) completely.   3. Breathe in (inhale) slowly through your mouth as deeply as you can. As you take the breath, you will see the piston rise inside the large column. While the piston rises, the indicator on the right should move upwards. It should stay in between the 2 arrows (see Figure 1).  4. Try to get the piston as high as you can, while keeping the indicator between the arrows. If the indicator doesn???t stay between the arrows, you???re breathing either too fast or too slow.  5. When you get it as high as you can, hold your breath for 10 seconds, or as long as possible. While you???re holding your breath, the piston will slowly fall to the base of the spirometer.  6. Once the piston reaches the bottom of the spirometer, breathe out slowly through your mouth. Rest for a few seconds.  7. Repeat 10 times. Try to get the piston to the same level with each breath.  8. After each set of 10 breaths, try to cough as coughing will help loosen or clear any mucus in your lungs.  9. Put the marker at the level the piston reached on your incentive spirometer. This will be your goal next time.  Repeat these steps every hour that you???re awake.  Cover the mouthpiece of the incentive spirometer when you aren???t using it

## 2019-04-04 NOTE — Interval H&P Note (Signed)
[facility]  Notes by Kyung Bacca, RN at 04/04/19 1230                Author: Kyung Bacca, RN  Service: NURSING  Author Type: Registered Nurse       Filed: 04/04/19 1327  Date of Service: 04/04/19 1230  Status: Signed          Editor: Kyung Bacca, RN (Registered Nurse)               PLEASE CONTINUE TAKING ALL PRESCRIPTION MEDICATIONS UP TO THE DAY OF SURGERY UNLESS OTHERWISE DIRECTED BELOW.      DISCONTINUE all vitamins and supplements 7 days prior to surgery.  DISCONTINUE Non-Steriodal Anti-Inflammatory (NSAIDS) such as Advil and Aleve 5 days prior to surgery.         Home Medications to take   the day of surgery        Buspirone (Buspar)    Duloxetine (Cymbalta)        Nucynta if needed                 Home Medications    to Hold                        Comments        Covid test 04/04/19 @ 2 [address] [place], [place]               *Leisure centre manager of 1 visitor per patient discussed.           Please do not bring home medications with you on the day of surgery unless otherwise directed by your nurse.  If you are instructed to bring home medications, please give them to your nurse as they will be administered by the nursing staff.      If you have any questions, please call [address] [place] [phone] or [address]  [place] [phone].      A copy of this note was provided to the patient for reference.       How to Use Your Incentive Spirometer            About Your Incentive Spirometer   An incentive spirometer is a device that will expand your lungs by helping you to breathe more deeply and fully. The parts of your incentive spirometer are labeled in Figure 1.      Using your incentive spirometer   When youre using your incentive spirometer, make sure to breathe through your mouth. If you breathe through your nose, the incentive spirometer wont work properly. You can hold your  nose if you have trouble. DO NOT BLOW INTO THE DEVICE. If you feel dizzy at any time, stop and rest.  Try again at a later time.   1.  Sit upright in a chair or in bed. Hold the incentive spirometer at eye level.    2.  Put the mouthpiece in your mouth and close your lips tightly around it. Slowly breathe out (exhale) completely.   3.  Breathe in (inhale) slowly through your mouth as deeply as you can. As you take the breath, you will see the piston rise inside the large column. While the piston rises, the indicator on the right should move upwards. It should stay in between the 2 arrows  (see Figure 1).   4.  Try to get the piston as high as you can, while keeping the indicator between the  arrows. If the indicator doesnt stay between the arrows, youre breathing either too fast or too slow.   5.  When you get it as high as you can, hold your breath for 10 seconds, or as long as possible. While youre holding your breath, the piston will slowly fall to the base of the spirometer.   6.  Once the piston reaches the bottom of the spirometer, breathe out slowly through your mouth. Rest for a few seconds.   7.  Repeat 10 times. Try to get the piston to the same level with each breath.   8.  After each set of 10 breaths, try to cough as coughing will help loosen or clear any mucus in your lungs.   9.  Put the marker at the level the piston reached on your incentive spirometer. This will be your goal next time.   Repeat these steps every hour that youre awake.   Cover the mouthpiece of the incentive spirometer when you arent using it

## 2019-04-04 NOTE — Interval H&P Note (Signed)
Patient verified name, DOB, and surgery as listed in [facility]. Patient provided medical/health information and PTA medications to the best of their ability.    TYPE  CASE: II  Orders per surgeon: were received  Labs per surgeon: CBC w/ diff,BMP, UA, and MRSA.  Labs per anesthesia protocol: no additional labs required.   EKG:  Not required per anesthesia protocol    Patient COVID test date 04/04/19; Patient did show for the appointment. The testing center is located at the [facility] [address] [place]. If appointment is needed patient provided telephone number of [phone].      Nasal Swab collected per MD order.     Patient provided with and instructed on education handouts including Guide to Surgery, blood transfusions, pain management, and hand hygiene for the family and community, and Art gallery manager.     Road to Recovery Spine surgery patient guide given. Instructed on incentive spirometry with return demonstration. Patient viewed spine prehab video.     Hibiclens and instructions given per hospital policy.    Original medication prescription bottles not visualized during patient appointment.     Patient teach back successful and patient demonstrates knowledge of instruction.

## 2019-04-04 NOTE — Interval H&P Note (Signed)
Recent Results (from the past 12 hour(s))   CBC WITH AUTOMATED DIFF    Collection Time: 04/04/19  1:12 PM   Result Value Ref Range    WBC 8.7 4.3 - 11.1 K/uL    RBC 4.56 4.05 - 5.2 M/uL    HGB 13.5 11.7 - 15.4 g/dL    HCT 41.6 60.6 - 30.1 %    MCV 90.6 79.6 - 97.8 FL    MCH 29.6 26.1 - 32.9 PG    MCHC 32.7 31.4 - 35.0 g/dL    RDW 60.1 09.3 - 23.5 %    PLATELET 345 150 - 450 K/uL    MPV 10.8 9.4 - 12.3 FL    ABSOLUTE NRBC 0.00 0.0 - 0.2 K/uL    DF AUTOMATED      NEUTROPHILS 69 43 - 78 %    LYMPHOCYTES 21 13 - 44 %    MONOCYTES 7 4.0 - 12.0 %    EOSINOPHILS 2 0.5 - 7.8 %    BASOPHILS 1 0.0 - 2.0 %    IMMATURE GRANULOCYTES 0 0.0 - 5.0 %    ABS. NEUTROPHILS 5.9 1.7 - 8.2 K/UL    ABS. LYMPHOCYTES 1.9 0.5 - 4.6 K/UL    ABS. MONOCYTES 0.6 0.1 - 1.3 K/UL    ABS. EOSINOPHILS 0.2 0.0 - 0.8 K/UL    ABS. BASOPHILS 0.1 0.0 - 0.2 K/UL    ABS. IMM. GRANS. 0.0 0.0 - 0.5 K/UL   METABOLIC PANEL, BASIC    Collection Time: 04/04/19  1:12 PM   Result Value Ref Range    Sodium 143 136 - 145 mmol/L    Potassium 3.1 (L) 3.5 - 5.1 mmol/L    Chloride 110 (H) 98 - 107 mmol/L    CO2 26 21 - 32 mmol/L    Anion gap 7 7 - 16 mmol/L    Glucose 106 (H) 65 - 100 mg/dL    BUN 14 6 - 23 MG/DL    Creatinine 5.73 0.6 - 1.0 MG/DL    GFR est AA >22 >02 RK/YHC/6.23J6    GFR est non-AA >60 >60 ml/min/1.64m2    Calcium 9.0 8.3 - 10.4 MG/DL   URINALYSIS W/ RFLX MICROSCOPIC    Collection Time: 04/04/19  1:12 PM   Result Value Ref Range    Color YELLOW      Appearance CLEAR      Specific gravity 1.019 1.001 - 1.023      pH (UA) 5.5 5.0 - 9.0      Protein Negative NEG mg/dL    Glucose Negative mg/dL    Ketone Negative NEG mg/dL    Bilirubin Negative NEG      Blood Negative NEG      Urobilinogen 0.2 0.2 - 1.0 EU/dL    Nitrites Negative NEG      Leukocyte Esterase TRACE (A) NEG      WBC 0-3 0 /hpf    RBC 0 0 /hpf    Epithelial cells 0-3 0 /hpf    Bacteria TRACE 0 /hpf    Casts 0 0 /lpf   MSSA/MRSA SC BY PCR, NASAL SWAB    Collection Time: 04/04/19  1:12 PM     Specimen: Swab   Result Value Ref Range    Special Requests: NO SPECIAL REQUESTS      Culture result:        SA target not detected.  A MRSA NEGATIVE, SA NEGATIVE test result does not preclude MRSA or SA nasal colonization.

## 2019-04-08 NOTE — H&P (Signed)
CC: NECK PAIN    HPI:  She is 58, and we have done an L3-L5 fusion.  She actually lives essentially in [place] and sees pain management in [place]. She has developed some spondylolisthesis L1, L2 and L2-L3 with stenosis, and we tried injections, and she got a lot better.  She has had a couple of falls. She finds it is hard to stand up as her legs want to give way.  She is also having new neck pain going out the arms with numbness and tingling into the first 3 fingers.  She seemed to have a little weakness in her biceps and triceps bilaterally.      IMAGING:  We ended up getting an MRI scan which I reviewed today.  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  It extends right and left, and there may even be some cord signal change right at this level.  At C5-C6 the canal is wide open, but there is some foraminal narrowing on the right, but this is a spur. This is a longstanding issue. It is not an acute problem, and the rest of the spine looked fairly unremarkable except for varying degrees of foraminal narrowing at places.      ASSESSMENT/PLAN:  I told her that the C4-C5 disc is a problem, and it really needs to be addressed because I think it is causing cord injury. She could have permanent weakness, and I think it is probably causing the legs to be weak and tired.  I explained that she would need an anterior discectomy, interbody fusion and plating. I showed her where we would make the incision on the neck and how we dissect down between the breathing and swallowing tube and the big blood vessel of the head, go down the spine, take out the disc and once we have it out we put an interbody spacer, either a bone graft, titanium or PEEK cage with bone graft substitute and anterior plate.  Usually that can be done as an outpatient.  She does live several hours away so probably in her situation, it would be better to stay overnight so if she had any problems several hours away would not be good. She still has a problem in the lumbar area, but I would address this first.  I went through the risks including death, paralysis, infection, bleeding, heart attack, stroke, clot in leg, clot in lung, dural tear, failure of fusion, failure of the instrumentation, and failure to get pain relief.  She understands and wants to proceed so we will schedule her for a C4-C5 ACDF and plating.         Electronically Signed By Seward Meth MD

## 2019-04-08 NOTE — H&P (Signed)
H&P by Wilmon Pali, PA at 04/08/19  1249                Author: Wilmon Pali, PA  Service: Orthopedic Surgery  Author Type: Physician Assistant       Filed: 04/08/19 1249  Date of Service: 04/08/19 1249  Status: Signed           Editor: Wilmon Pali, PA (Physician Assistant)  Cosigner: Wandra Mannan, MD at 04/08/19 1556               CC: NECK PAIN      HPI:  She is 58, and we have done an L3-L5 fusion.  She actually lives essentially in [place] and sees pain management in [place]. She has developed some spondylolisthesis L1, L2 and  L2-L3 with stenosis, and we tried injections, and she got a lot better.  She has had a couple of falls. She finds it is hard to stand up as her legs want to give way.  She is also having new neck pain going out the arms with numbness and tingling into  the first 3 fingers.  She seemed to have a little weakness in her biceps and triceps bilaterally.        IMAGING:  We ended up getting an MRI scan which I reviewed today.  On the scan she has a pretty significant disc herniation  at C4-C5.  It extends right and left, and there may even be some cord signal change right at this level.  At C5-C6 the canal is wide open, but there is some foraminal narrowing on the right, but this is a spur. This is a longstanding issue. It is not  an acute problem, and the rest of the spine looked fairly unremarkable except for varying degrees of foraminal narrowing at places.       ASSESSMENT/PLAN:  I told her that the C4-C5 disc is a problem, and it really needs to be addressed because I think it is causing  cord injury. She could have permanent weakness, and I think it is probably causing the legs to be weak and tired.  I explained that she would need an anterior discectomy, interbody fusion and plating. I showed her where we would make the incision on the  neck and how we dissect down between the breathing and swallowing tube and the big blood vessel of the head, go down the spine, take out the  disc and once we have it out we put an interbody spacer, either a bone graft, titanium or PEEK cage with bone  graft substitute and anterior plate.  Usually that can be done as an outpatient.  She does live several hours away so probably in her situation, it would be better to stay overnight so if she had any problems several hours away would not be good. She  still has a problem in the lumbar area, but I would address this first.  I went through the risks including death, paralysis, infection, bleeding, heart attack, stroke, clot in leg, clot in lung, dural tear, failure of fusion, failure of the instrumentation,  and failure to get pain relief.  She understands and wants to proceed so we will schedule her for a C4-C5 ACDF and plating.               Electronically Signed By Seward Meth MD

## 2019-04-11 ENCOUNTER — Inpatient Hospital Stay: Admit: 2019-04-11 | Payer: MEDICARE | Attending: Orthopaedic Surgery | Primary: Family Medicine

## 2019-04-11 ENCOUNTER — Inpatient Hospital Stay: Payer: MEDICARE

## 2019-04-11 DIAGNOSIS — F329 Major depressive disorder, single episode, unspecified: Secondary | ICD-10-CM | POA: Diagnosis not present

## 2019-04-11 DIAGNOSIS — M50221 Other cervical disc displacement at C4-C5 level: Secondary | ICD-10-CM | POA: Diagnosis not present

## 2019-04-11 DIAGNOSIS — I10 Essential (primary) hypertension: Secondary | ICD-10-CM | POA: Diagnosis not present

## 2019-04-11 DIAGNOSIS — M4802 Spinal stenosis, cervical region: Secondary | ICD-10-CM | POA: Diagnosis not present

## 2019-04-11 DIAGNOSIS — Z6832 Body mass index (BMI) 32.0-32.9, adult: Secondary | ICD-10-CM | POA: Diagnosis not present

## 2019-04-11 DIAGNOSIS — J45909 Unspecified asthma, uncomplicated: Secondary | ICD-10-CM | POA: Diagnosis not present

## 2019-04-11 DIAGNOSIS — M5020 Other cervical disc displacement, unspecified cervical region: Secondary | ICD-10-CM | POA: Diagnosis not present

## 2019-04-11 DIAGNOSIS — E669 Obesity, unspecified: Secondary | ICD-10-CM | POA: Diagnosis not present

## 2019-04-11 DIAGNOSIS — M1990 Unspecified osteoarthritis, unspecified site: Secondary | ICD-10-CM | POA: Diagnosis not present

## 2019-04-11 DIAGNOSIS — F419 Anxiety disorder, unspecified: Secondary | ICD-10-CM | POA: Diagnosis not present

## 2019-04-11 DIAGNOSIS — I158 Other secondary hypertension: Secondary | ICD-10-CM | POA: Diagnosis not present

## 2019-04-11 MED ORDER — NEOSTIGMINE METHYLSULFATE 3 MG/3 ML (1 MG/ML) IV SYRINGE
3 mg/ mL (1 mg/mL) | INTRAVENOUS | Status: DC | PRN
Start: 2019-04-11 — End: 2019-04-11
  Administered 2019-04-11: 23:00:00 via INTRAVENOUS

## 2019-04-11 MED ORDER — MIDAZOLAM 1 MG/ML IJ SOLN
1 mg/mL | Freq: Once | INTRAMUSCULAR | Status: AC | PRN
Start: 2019-04-11 — End: 2019-04-11
  Administered 2019-04-11: 21:00:00 via INTRAVENOUS

## 2019-04-11 MED ORDER — LIDOCAINE (PF) 20 MG/ML (2 %) IJ SOLN
20 mg/mL (2 %) | INTRAMUSCULAR | Status: DC | PRN
Start: 2019-04-11 — End: 2019-04-11
  Administered 2019-04-11: 21:00:00 via INTRAVENOUS

## 2019-04-11 MED ORDER — PROMETHAZINE 25 MG/ML INJECTION
25 mg/mL | Freq: Once | INTRAMUSCULAR | Status: DC | PRN
Start: 2019-04-11 — End: 2019-04-11

## 2019-04-11 MED ORDER — EPHEDRINE (PF) 50 MG/5 ML (10 MG/ML) IN NS IV SYRINGE
50 mg/5 mL (10 mg/mL) | INTRAVENOUS | Status: DC | PRN
Start: 2019-04-11 — End: 2019-04-11
  Administered 2019-04-11: 22:00:00 via INTRAVENOUS

## 2019-04-11 MED ORDER — CEFAZOLIN 2 GRAM/20 ML IN STERILE WATER INTRAVENOUS SYRINGE
2 gram/0 mL | Freq: Once | INTRAVENOUS | Status: AC
Start: 2019-04-11 — End: 2019-04-11
  Administered 2019-04-11: 21:00:00 via INTRAVENOUS

## 2019-04-11 MED ORDER — SEVOFLURANE FOR INHALATION
RESPIRATORY_TRACT | Status: AC
Start: 2019-04-11 — End: ?

## 2019-04-11 MED ORDER — FENTANYL CITRATE (PF) 50 MCG/ML IJ SOLN
50 mcg/mL | Freq: Once | INTRAMUSCULAR | Status: DC
Start: 2019-04-11 — End: 2019-04-11

## 2019-04-11 MED ORDER — HYDROMORPHONE (PF) 2 MG/ML IJ SOLN
2 mg/mL | INTRAMUSCULAR | Status: AC
Start: 2019-04-11 — End: ?

## 2019-04-11 MED ORDER — ALBUTEROL SULFATE 0.083 % (0.83 MG/ML) SOLN FOR INHALATION
2.5 mg /3 mL (0.083 %) | RESPIRATORY_TRACT | Status: DC | PRN
Start: 2019-04-11 — End: 2019-04-11

## 2019-04-11 MED ORDER — PHENYLEPHRINE 10 MG/ML INJECTION
10 mg/mL | INTRAMUSCULAR | Status: DC | PRN
Start: 2019-04-11 — End: 2019-04-11
  Administered 2019-04-11: 22:00:00 via INTRAVENOUS

## 2019-04-11 MED ORDER — VANCOMYCIN 1,000 MG IV SOLR
1000 mg | INTRAVENOUS | Status: AC
Start: 2019-04-11 — End: ?

## 2019-04-11 MED ORDER — HYDROMORPHONE (PF) 2 MG/ML IJ SOLN
2 mg/mL | INTRAMUSCULAR | Status: DC | PRN
Start: 2019-04-11 — End: 2019-04-11
  Administered 2019-04-11: 22:00:00 via INTRAVENOUS

## 2019-04-11 MED ORDER — FENTANYL CITRATE (PF) 50 MCG/ML IJ SOLN
50 mcg/mL | INTRAMUSCULAR | Status: DC | PRN
Start: 2019-04-11 — End: 2019-04-11
  Administered 2019-04-11: 21:00:00 via INTRAVENOUS

## 2019-04-11 MED ORDER — HYDROMORPHONE (PF) 2 MG/ML IJ SOLN
2 mg/mL | INTRAMUSCULAR | Status: AC | PRN
Start: 2019-04-11 — End: 2019-04-11
  Administered 2019-04-11 – 2019-04-12 (×4): via INTRAVENOUS

## 2019-04-11 MED ORDER — LIDOCAINE HCL 1 % (10 MG/ML) IJ SOLN
10 mg/mL (1 %) | INTRAMUSCULAR | Status: DC | PRN
Start: 2019-04-11 — End: 2019-04-11

## 2019-04-11 MED ORDER — FENTANYL CITRATE (PF) 50 MCG/ML IJ SOLN
50 mcg/mL | INTRAMUSCULAR | Status: AC
Start: 2019-04-11 — End: ?

## 2019-04-11 MED ORDER — LACTATED RINGERS IV
INTRAVENOUS | Status: DC
Start: 2019-04-11 — End: 2019-04-11
  Administered 2019-04-11 (×2): via INTRAVENOUS

## 2019-04-11 MED ORDER — CEFAZOLIN 1 GRAM SOLUTION FOR INJECTION
1 gram | INTRAMUSCULAR | Status: AC
Start: 2019-04-11 — End: ?

## 2019-04-11 MED ORDER — PROPOFOL 10 MG/ML IV EMUL
10 mg/mL | INTRAVENOUS | Status: DC | PRN
Start: 2019-04-11 — End: 2019-04-11
  Administered 2019-04-11: 21:00:00 via INTRAVENOUS

## 2019-04-11 MED ORDER — ROCURONIUM 10 MG/ML IV
10 mg/mL | INTRAVENOUS | Status: DC | PRN
Start: 2019-04-11 — End: 2019-04-11
  Administered 2019-04-11: 21:00:00 via INTRAVENOUS

## 2019-04-11 MED ORDER — ALCOHOL 62% (NOZIN) NASAL SANITIZER
62 % | Freq: Once | CUTANEOUS | Status: AC
Start: 2019-04-11 — End: 2019-04-11
  Administered 2019-04-11: 19:00:00 via TOPICAL

## 2019-04-11 MED ORDER — MIDAZOLAM 1 MG/ML IJ SOLN
1 mg/mL | Freq: Once | INTRAMUSCULAR | Status: DC
Start: 2019-04-11 — End: 2019-04-11

## 2019-04-11 MED ORDER — BUPIVACAINE-EPINEPHRINE (PF) 0.5 %-1:200,000 IJ SOLN
0.5 %-1:200,000 | INTRAMUSCULAR | Status: AC
Start: 2019-04-11 — End: ?

## 2019-04-11 MED ORDER — LACTATED RINGERS IV
INTRAVENOUS | Status: DC
Start: 2019-04-11 — End: 2019-04-11

## 2019-04-11 MED ORDER — ACETAMINOPHEN 500 MG TAB
500 mg | Freq: Once | ORAL | Status: AC
Start: 2019-04-11 — End: 2019-04-11
  Administered 2019-04-11: 19:00:00 via ORAL

## 2019-04-11 MED ORDER — ONDANSETRON (PF) 4 MG/2 ML INJECTION
4 mg/2 mL | INTRAMUSCULAR | Status: DC | PRN
Start: 2019-04-11 — End: 2019-04-11
  Administered 2019-04-11: 22:00:00 via INTRAVENOUS

## 2019-04-11 MED ORDER — OXYCODONE 5 MG TAB
5 mg | Freq: Once | ORAL | Status: AC | PRN
Start: 2019-04-11 — End: 2019-04-11
  Administered 2019-04-12: 01:00:00 via ORAL

## 2019-04-11 MED ORDER — DEXAMETHASONE SODIUM PHOSPHATE 4 MG/ML IJ SOLN
4 mg/mL | INTRAMUSCULAR | Status: DC | PRN
Start: 2019-04-11 — End: 2019-04-11
  Administered 2019-04-11: 22:00:00 via INTRAVENOUS

## 2019-04-11 MED ORDER — THROMBIN (BOVINE) 5,000 UNIT TOPICAL SOLUTION
5000 unit | CUTANEOUS | Status: AC
Start: 2019-04-11 — End: ?

## 2019-04-11 MED ORDER — GLYCOPYRROLATE 0.2 MG/ML IJ SOLN
0.2 mg/mL | INTRAMUSCULAR | Status: DC | PRN
Start: 2019-04-11 — End: 2019-04-11
  Administered 2019-04-11: 23:00:00 via INTRAVENOUS

## 2019-04-11 MED FILL — HYDROMORPHONE (PF) 2 MG/ML IJ SOLN: 2 mg/mL | INTRAMUSCULAR | Qty: 1

## 2019-04-11 MED FILL — BUPIVACAINE-EPINEPHRINE (PF) 0.5 %-1:200,000 IJ SOLN: 0.5 %-1:200,000 | INTRAMUSCULAR | Qty: 10

## 2019-04-11 MED FILL — ACETAMINOPHEN 500 MG TAB: 500 mg | ORAL | Qty: 2

## 2019-04-11 MED FILL — VANCOMYCIN 1,000 MG IV SOLR: 1000 mg | INTRAVENOUS | Qty: 1000

## 2019-04-11 MED FILL — CEFAZOLIN 1 GRAM SOLUTION FOR INJECTION: 1 gram | INTRAMUSCULAR | Qty: 1000

## 2019-04-11 MED FILL — FENTANYL CITRATE (PF) 50 MCG/ML IJ SOLN: 50 mcg/mL | INTRAMUSCULAR | Qty: 2

## 2019-04-11 MED FILL — ALCOHOL 62% (NOZIN) NASAL SANITIZER: 62 % | CUTANEOUS | Qty: 3

## 2019-04-11 MED FILL — THROMBIN-JMI 5,000 UNIT TOPICAL SOLUTION: 5000 unit | CUTANEOUS | Qty: 5000

## 2019-04-11 MED FILL — ULTANE INHALATION LIQUID: RESPIRATORY_TRACT | Qty: 250

## 2019-04-11 MED FILL — MIDAZOLAM 1 MG/ML IJ SOLN: 1 mg/mL | INTRAMUSCULAR | Qty: 2

## 2019-04-11 MED FILL — CEFAZOLIN 2 GRAM/20 ML IN STERILE WATER INTRAVENOUS SYRINGE: 2 gram/0 mL | INTRAVENOUS | Qty: 20

## 2019-04-11 NOTE — Other (Signed)
Family updated at 5:49 PM by Courtney D Kinard, RN.

## 2019-04-11 NOTE — Anesthesia Pre-Procedure Evaluation (Signed)
Relevant Problems   No relevant active problems       Anesthetic History     History of awareness of surgery under anesthesia (during a coccyx surgery ~1980s)          Review of Systems / Medical History  Nursing notes reviewed and pertinent labs reviewed    Pulmonary              Pertinent negatives: No asthma     Neuro/Psych         Psychiatric history     Cardiovascular    Hypertension: well controlled              Exercise tolerance: >4 METS     GI/Hepatic/Renal  Within defined limits              Endo/Other        Obesity and arthritis     Other Findings              Physical Exam    Airway             Cardiovascular    Rhythm: regular           Dental         Pulmonary  Breath sounds clear to auscultation               Abdominal         Other Findings            Anesthetic Plan    ASA: 2  Anesthesia type: general          Induction: Intravenous  Anesthetic plan and risks discussed with: Patient and Son / Daughter

## 2019-04-11 NOTE — Other (Signed)
Dr. Duff notified about patients BP of 177/85. No new orders received.

## 2019-04-11 NOTE — Other (Signed)
TRANSFER - OUT REPORT:    Verbal report given to [place], RN on Kristin [name]  being transferred to 632 for routine post - op       Report consisted of patient???s Situation, Background, Assessment and   Recommendations(SBAR).     Information from the following report(s) SBAR, OR Summary, Procedure Summary, Recent Results and Cardiac Rhythm nsr was reviewed with the receiving nurse.    Lines:   Peripheral IV 04/11/19 Right Antecubital (Active)   Site Assessment Clean, dry, & intact 04/11/19 1837   Phlebitis Assessment 0 04/11/19 1837   Infiltration Assessment 0 04/11/19 1837   Dressing Status Clean, dry, & intact 04/11/19 1837   Dressing Type Transparent;Tape 04/11/19 1837   Hub Color/Line Status Patent;Infusing 04/11/19 1837   Alcohol Cap Used No 04/11/19 1837        Opportunity for questions and clarification was provided.      Patient transported with:   O2 @ 2 liters  Tech    VTE prophylaxis orders have been written for Kristin [name].    Patient and family given floor number and nurses name.  Family updated re: pt status after security code verified.

## 2019-04-11 NOTE — Brief Op Note (Signed)
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 Item Serial No. Manufacturer Lot No. LRB No. Used Action   SERVICE FEE 1CC BIO DBM PTTY - [id number]  SERVICE FEE 1CC BIO DBM PTTY  STRYKER SPINE HOWM_WD [id number] N/A 1 Implanted   GRAFT BNE XS - [id number]  GRAFT BNE XS [id number] [employer]  N/A 1 Implanted   CAGE SPNL [id number] ANT CERV TI LORD W/ 3 SCR TIFIX LOK - [id number]  CAGE SPNL [id number] ANT CERV TI LORD W/ 3 SCR TIFIX LOK  STRYKER SPINE HOWM_WD [id number] N/A 1 Implanted   SCREW SPNL L12MM DIA3.5MM 14DEG CONVERGING ANG ANT CERV TI - [id number]  SCREW SPNL L12MM DIA3.5MM 14DEG CONVERGING ANG ANT CERV TI  STRYKER SPINE HOWM_WD [id number] N/A 2 Implanted   SCREW SPNL L12MM DIA3.85MM 14DEG CONVERGING ANG ANTR CERV TI - [id number]  SCREW SPNL L12MM DIA3.85MM 14DEG CONVERGING ANG ANTR CERV TI  K2M INC_WD [id number] N/A 1 Implanted       Drains: * No LDAs found *    Findings: HNP    Electronically Signed by Silas E Lucas, MD on 04/11/2019 at 6:18 PM

## 2019-04-11 NOTE — Progress Notes (Cosign Needed)
04/11/19 2007   Dual Skin Pressure Injury Assessment   Dual Skin Pressure Injury Assessment WDL  (anterior neck )   Second Care Provider (Based on Facility Policy) Reba,RN

## 2019-04-11 NOTE — Progress Notes (Signed)
Chaplain- Visited as requested, to offer spiritual support and prayer prior to surgery.  Pt's daughter was present.     Howard Daniel Kirkpatrick, MDiv, BCC  Chaplain

## 2019-04-11 NOTE — Progress Notes (Signed)
TRANSFER - IN REPORT:    Verbal report received from Stephanie, RN on Tom Annette Beringer  being received from OR ffor routine progression of care      Report consisted of patient???s Situation, Background, Assessment and   Recommendations(SBAR).     Information from the following report(s) Kardex was reviewed with the receiving nurse.    Opportunity for questions and clarification was provided.      Assessment completed upon patient???s arrival to unit and care assumed.

## 2019-04-11 NOTE — Anesthesia Post-Procedure Evaluation (Signed)
Procedure(s):  C4-C5 SPINE ANTERIOR CERVICAL FUSION WITH INSTRUMENTATION NEED EXTRA TECH.    general    Anesthesia Post Evaluation      Multimodal analgesia: multimodal analgesia used between 6 hours prior to anesthesia start to PACU discharge  Patient location during evaluation: PACU  Patient participation: complete - patient participated  Level of consciousness: awake and alert  Pain management: adequate  Airway patency: patent  Anesthetic complications: no  Cardiovascular status: acceptable  Respiratory status: acceptable  Hydration status: acceptable  Post anesthesia nausea and vomiting:  controlled  Final Post Anesthesia Temperature Assessment:  Normothermia (36.0-37.5 degrees C)      INITIAL Post-op Vital signs:   Vitals Value Taken Time   BP 170/81 04/11/19 1950   Temp 36.4 ??C (97.5 ??F) 04/11/19 1934   Pulse 87 04/11/19 1950   Resp 18 04/11/19 1944   SpO2 100 % 04/11/19 1950   Vitals shown include unvalidated device data.

## 2019-04-11 NOTE — Op Note (Signed)
[facility]  OPERATIVE REPORT    Name:  Kristin [name], Kristin [name]  MR#:  [id number]  DOB:  1960-03-28  ACCOUNT #:  [id number]  DATE OF SERVICE:  04/11/2019    PREOPERATIVE DIAGNOSIS:  C4-5 herniated cervical disc.    POSTOPERATIVE DIAGNOSIS:  C4-5 herniated cervical disc.    PROCEDURES PERFORMED:  1.  C4-5 anterior diskectomy and interbody fusion. CPT code 22551  2.  Placement of biomechanical interbody device at C4-5 using the [employer] interbody implant. CPT code [phone]  3.  C4-5 anterior instrumentation using the Stryker [place] plate.CPT code 22845    SURGEON:  Danella Sensing, MD    ASSISTANT:  Staff    ANESTHESIA:  GETA.    COMPLICATIONS:  None.    SPECIMENS REMOVED:  None.    IMPLANTS:  All Stryker.    ESTIMATED BLOOD LOSS:  Minimal.    FLUIDS:  500 mL of crystalloid.    DRAINS:  None.    INDICATIONS:  The patient is a 59 year old female who has had previous lumbar surgery, who for several months has had acute onset of neck and arm pain that has been unrelieved with medications, activity restriction, epidural steroid injection so she is here for surgical treatment.     PROCEDURE:  The patient was brought to the operating room.  After administration of anesthesia and IV antibiotics and placement of monitoring lines, she was placed supine on the operating room table.  With a roll under her shoulders, her shoulders were taped down and her head was slightly turned to the left side.  At this point, surgeon called a time-out and confirmed the procedure to be performed, her allergy history, and the fact that she had received her preoperative IV antibiotics.  We made a mark from the midline cervical spine right at the bottom of the cricoid cartilage, curved posteriorly towards the sternocleidomastoid, then incised this with a scalpel and dissected through the underlying platysma muscle and soft tissue with electrocautery until we had dissected through the platysma.  The incision was in line with the skin incision as well.  We then found a plane between the sternocleidomastoid and the strap muscles and we dissected in this area, retracting the tissues.  We kept the carotid sheath posterior and we were able to palpate to the anterior aspect of the cervical spine and we identified the longus coli muscle.  We incised the prevertebral fascia with scissors and then we used a Kittner to strip off the tissues and then we placed a needle in the disc space, took a lateral C-arm x-ray that showed [place] we were actually at C5-6 so we just pushed our dissection a little higher until we came to the C4-5 disc space.  We elevated the longus coli and we put in our self-retaining retractor blades and self-retaining retractor then placed the cast bar pins in C4 and C5 and distracted.  At this point, the operating microscope was brought in and we incised the disc annulus with electrocautery and started removing disc with pituitaries and curettes and once we had removed enough, we were able to distract the disc space further.  We removed all the disc and we scraped the cartilaginous endplates and we ended up  burring down the endplates of C4 and C5 to make them parallel.  We dissected all the way back posteriorly and identified some posterior spur which we used a Kerrison 1 to excise and then identified the posterior longitudinal ligament and we  opened this with the Kerrison and dissected out and we found a large amount of disc herniation posterior to the posterior longitudinal ligament and we removed all the pieces and after we had done that, we could find no other compression of the cervical spine.  So we performed foraminotomies, made sure they were free with curettes and Kerrison, and then we trialed for the appropriate-size [place] interbody titanium device and a 7-mm device in height, lordotic in shape, appeared to be the appropriate fit.  So we obtained the device and attached the anterior plate and we removed our cast bar pins and we malleted the interbody device into place and checked with lateral C-arm until we had it appropriately positioned.  I did have to bur off the spur off the anterior endplates at both C4 and C5 before we did this.  Once it appeared well seated, we drilled through the plate holes and inserted the appropriate screws and securely tightened them and then we took an AP and lateral C-arm picture that showed the interbody device and the anterior fixation to be secure.  No problem was seen.  So we irrigated the wound, we placed some FloSeal on top of the surgical site.  We checked for any bleeders and did not see any and so we were ready to close the wound.  The platysma muscle was reapproximated with a running locking suture of 3-0 Vicryl.  The subcutaneous tissue was reapproximated with 2-0 Vicryl.  The skin was closed with a running subcuticular stitch of 3-0 Vicryl and Dermabond Prineo was placed across the incision then a dry sterile dressing.      Wandra Mannan III, MD      [id number]  D:  04/11/2019 18:42  T:  04/11/2019 20:27  JOB #:  [id number]  CC:  Judd Lien, MD

## 2019-04-11 NOTE — Interval H&P Note (Signed)
Dr. Cristela Felt notified about patients BP of 177/85. No new orders received.

## 2019-04-11 NOTE — Interval H&P Note (Signed)
Family updated at 5:49 PM by Jilda Roche, RN.

## 2019-04-11 NOTE — Anesthesia Pre-Procedure Evaluation (Signed)
Relevant Problems   No relevant active problems       Anesthetic History     History of awareness of surgery under anesthesia (during a coccyx surgery ~1980s)          Review of Systems / Medical History  Nursing notes reviewed and pertinent labs reviewed    Pulmonary              Pertinent negatives: No asthma     Neuro/Psych         Psychiatric history     Cardiovascular    Hypertension: well controlled              Exercise tolerance: >4 METS     GI/Hepatic/Renal  Within defined limits              Endo/Other        Obesity and arthritis     Other Findings              Physical Exam    Airway             Cardiovascular    Rhythm: regular           Dental         Pulmonary  Breath sounds clear to auscultation               Abdominal         Other Findings            Anesthetic Plan    ASA: 2  Anesthesia type: general          Induction: Intravenous  Anesthetic plan and risks discussed with: Patient and Son / Daughter

## 2019-04-11 NOTE — Progress Notes (Deleted)
04/11/19 2007   Dual Skin Pressure Injury Assessment   Dual Skin Pressure Injury Assessment WDL  (anterior neck )   Second Care Provider (Based on Facility Policy) Reba,RN

## 2019-04-11 NOTE — Op Note (Signed)
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 Item Serial No. Manufacturer Lot No. LRB No. Used Action   SERVICE FEE 1CC BIO DBM PTTY - [id number]  SERVICE FEE 1CC BIO DBM [employer] SPINE HOWM_WD [id number] N/A 1 Implanted   GRAFT BNE XS - [id number]  GRAFT BNE XS [id number] [employer]  N/A 1 Implanted   CAGE SPNL [id number] ANT CERV TI LORD W/ 3 SCR TIFIX LOK - [id number]  CAGE SPNL [id number] ANT CERV TI LORD W/ 3 SCR TIFIX LOK  STRYKER SPINE HOWM_WD [id number] N/A 1 Implanted   SCREW SPNL L12MM DIA3.5MM 14DEG CONVERGING ANG ANT CERV TI - [id number]  SCREW SPNL L12MM DIA3.5MM 14DEG CONVERGING ANG ANT CERV TI  STRYKER SPINE HOWM_WD [id number] N/A 2 Implanted   SCREW SPNL L12MM DIA3. 14DEG CONVERGING ANG ANTR CERV TI - [id number]  SCREW SPNL L12MM DIA3. 14DEG CONVERGING ANG ANTR CERV TI  [employer]_WD [id number] N/A 1 Implanted       Drains: * No LDAs found *    Findings: HNP    Electronically Signed by Wandra Mannan, MD on 04/11/2019 at 6:18 PM

## 2019-04-11 NOTE — Interval H&P Note (Signed)
 TRANSFER - OUT REPORT:    Verbal report given to [place], RN on Nikiah Goin  being transferred to 632 for routine post - op       Report consisted of patient's Situation, Background, Assessment and   Recommendations(SBAR).     Information from the following report(s) SBAR, OR Summary, Procedure Summary, Recent Results and Cardiac Rhythm nsr was reviewed with the receiving nurse.    Lines:   Peripheral IV 04/11/19 Right Antecubital (Active)   Site Assessment Clean, dry, & intact 04/11/19 1837   Phlebitis Assessment 0 04/11/19 1837   Infiltration Assessment 0 04/11/19 1837   Dressing Status Clean, dry, & intact 04/11/19 1837   Dressing Type Transparent;Tape 04/11/19 1837   Hub Color/Line Status Patent;Infusing 04/11/19 1837   Alcohol  Cap Used No 04/11/19 1837        Opportunity for questions and clarification was provided.      Patient transported with:   O2 @ 2 liters  Tech    VTE prophylaxis orders have been written for [employer].    Patient and family given floor number and nurses name.  Family updated re: pt status after security code verified.

## 2019-04-11 NOTE — Progress Notes (Signed)
TRANSFER - IN REPORT:    Verbal report received from [place], RN on Kristin [name]  being received from OR ffor routine progression of care      Report consisted of patient's Situation, Background, Assessment and   Recommendations(SBAR).     Information from the following report(s) Kardex was reviewed with the receiving nurse.    Opportunity for questions and clarification was provided.      Assessment completed upon patient's arrival to unit and care assumed.

## 2019-04-11 NOTE — Anesthesia Post-Procedure Evaluation (Signed)
Procedure(s):  C4-C5 SPINE ANTERIOR CERVICAL FUSION WITH INSTRUMENTATION NEED EXTRA TECH.    general    Anesthesia Post Evaluation      Multimodal analgesia: multimodal analgesia used between 6 hours prior to anesthesia start to PACU discharge  Patient location during evaluation: PACU  Patient participation: complete - patient participated  Level of consciousness: awake and alert  Pain management: adequate  Airway patency: patent  Anesthetic complications: no  Cardiovascular status: acceptable  Respiratory status: acceptable  Hydration status: acceptable  Post anesthesia nausea and vomiting:  controlled  Final Post Anesthesia Temperature Assessment:  Normothermia (36.0-37.5 degrees C)      INITIAL Post-op Vital signs:   Vitals Value Taken Time   BP 170/81 04/11/19 1950   Temp 36.4 ??C (97.5 ??F) 04/11/19 1934   Pulse 87 04/11/19 1950   Resp 18 04/11/19 1944   SpO2 100 % 04/11/19 1950   Vitals shown include unvalidated device data.

## 2019-04-11 NOTE — Op Note (Signed)
[facility]    Name:  Kristin [name], Kristin [name]  MR#:  [id number]  DOB:  04-03-1960  ACCOUNT #:  [id number]  DATE OF SERVICE:  04/11/2019    PREOPERATIVE DIAGNOSIS:  C4-5 herniated cervical disc.    POSTOPERATIVE DIAGNOSIS:  C4-5 herniated cervical disc.    PROCEDURES PERFORMED:  1.  C4-5 anterior diskectomy and interbody fusion. CPT code 97026  2.  Placement of biomechanical interbody device at C4-5 using the [employer] interbody implant. CPT code 37858  3.  C4-5 anterior instrumentation using the Stryker [place] plate.CPT code 85027    SURGEON:  Judd Lien, MD    ASSISTANT:  Staff    ANESTHESIA:  GETA.    COMPLICATIONS:  None.    SPECIMENS REMOVED:  None.    IMPLANTS:  All Stryker.    ESTIMATED BLOOD LOSS:  Minimal.    FLUIDS:  500 mL of crystalloid.    DRAINS:  None.    INDICATIONS:  The patient is a 59 year old female who has had previous lumbar surgery, who for several months has had acute onset of neck and arm pain that has been unrelieved with medications, activity restriction, epidural steroid injection so she is here for surgical treatment.    PROCEDURE:  The patient was brought to the operating room.  After administration of anesthesia and IV antibiotics and placement of monitoring lines, she was placed supine on the operating room table.  With a roll under her shoulders, her shoulders were taped down and her head was slightly turned to the left side.  At this point, surgeon called a time-out and confirmed the procedure to be performed, her allergy history, and the fact that she had received her preoperative IV antibiotics.  We made a mark from the midline cervical spine right at the bottom of the cricoid cartilage, curved posteriorly towards the sternocleidomastoid, then incised this with a scalpel and dissected through the underlying platysma muscle and soft tissue with electrocautery until we had dissected through the platysma.  The incision was in line with the skin  incision as well.  We then found a plane between the sternocleidomastoid and the strap muscles and we dissected in this area, retracting the tissues.  We kept the carotid sheath posterior and we were able to palpate to the anterior aspect of the cervical spine and we identified the longus coli muscle.  We incised the prevertebral fascia with scissors and then we used a Kittner to strip off the tissues and then we placed a needle in the disc space, took a lateral C-arm x-ray that showed [place] we were actually at C5-6 so we just pushed our dissection a little higher until we came to the C4-5 disc space.  We elevated the longus coli and we put in our self-retaining retractor blades and self-retaining retractor then placed the cast bar pins in C4 and C5 and distracted.  At this point, the operating microscope was brought in and we incised the disc annulus with electrocautery and started removing disc with pituitaries and curettes and once we had removed enough, we were able to distract the disc space further.  We removed all the disc and we scraped the cartilaginous endplates and we ended up burring down the endplates of C4 and C5 to make them parallel.  We dissected all the way back posteriorly and identified some posterior spur which we used a Kerrison 1 to excise and then identified the posterior longitudinal ligament and we opened  this with the Kerrison and dissected out and we found a large amount of disc herniation posterior to the posterior longitudinal ligament and we removed all the pieces and after we had done that, we could find no other compression of the cervical spine.  So we performed foraminotomies, made sure they were free with curettes and Kerrison, and then we trialed for the appropriate-size [place] interbody titanium device and a 7-mm device in height, lordotic in shape, appeared to be the appropriate fit.  So we obtained the device and attached the anterior plate and we removed our cast bar pins  and we malleted the interbody device into place and checked with lateral C-arm until we had it appropriately positioned.  I did have to bur off the spur off the anterior endplates at both C4 and C5 before we did this.  Once it appeared well seated, we drilled through the plate holes and inserted the appropriate screws and securely tightened them and then we took an AP and lateral C-arm picture that showed the interbody device and the anterior fixation to be secure.  No problem was seen.  So we irrigated the wound, we placed some FloSeal on top of the surgical site.  We checked for any bleeders and did not see any and so we were ready to close the wound.  The platysma muscle was reapproximated with a running locking suture of 3-0 Vicryl.  The subcutaneous tissue was reapproximated with 2-0 Vicryl.  The skin was closed with a running subcuticular stitch of 3-0 Vicryl and Dermabond Prineo was placed across the incision then a dry sterile dressing.      Maryruth Bun III, MD      [id number]  D:  04/11/2019 18:42  T:  04/11/2019 20:27  JOB #:  [id number]  CC:  Danella Sensing, MD

## 2019-04-12 DIAGNOSIS — F419 Anxiety disorder, unspecified: Secondary | ICD-10-CM | POA: Diagnosis not present

## 2019-04-12 DIAGNOSIS — Z6832 Body mass index (BMI) 32.0-32.9, adult: Secondary | ICD-10-CM | POA: Diagnosis not present

## 2019-04-12 DIAGNOSIS — F329 Major depressive disorder, single episode, unspecified: Secondary | ICD-10-CM | POA: Diagnosis not present

## 2019-04-12 DIAGNOSIS — E669 Obesity, unspecified: Secondary | ICD-10-CM | POA: Diagnosis not present

## 2019-04-12 DIAGNOSIS — M1990 Unspecified osteoarthritis, unspecified site: Secondary | ICD-10-CM | POA: Diagnosis not present

## 2019-04-12 DIAGNOSIS — M5020 Other cervical disc displacement, unspecified cervical region: Secondary | ICD-10-CM | POA: Diagnosis not present

## 2019-04-12 DIAGNOSIS — M4802 Spinal stenosis, cervical region: Secondary | ICD-10-CM | POA: Diagnosis not present

## 2019-04-12 DIAGNOSIS — I10 Essential (primary) hypertension: Secondary | ICD-10-CM | POA: Diagnosis not present

## 2019-04-12 DIAGNOSIS — I158 Other secondary hypertension: Secondary | ICD-10-CM | POA: Diagnosis not present

## 2019-04-12 MED ORDER — CARVEDILOL 12.5 MG TAB
12.5 mg | Freq: Two times a day (BID) | ORAL | Status: DC
Start: 2019-04-12 — End: 2019-04-13
  Administered 2019-04-12 – 2019-04-13 (×3): via ORAL

## 2019-04-12 MED ORDER — ALCOHOL 62% (NOZIN) NASAL SANITIZER
62 % | Freq: Two times a day (BID) | CUTANEOUS | Status: DC
Start: 2019-04-12 — End: 2019-04-13
  Administered 2019-04-12 – 2019-04-13 (×4): via TOPICAL

## 2019-04-12 MED ORDER — PHENOL 1.4 % MUCOSAL AEROSOL SPRAY
1.4 % | Status: DC | PRN
Start: 2019-04-12 — End: 2019-04-13
  Administered 2019-04-13: 14:00:00 via ORAL

## 2019-04-12 MED ORDER — GABAPENTIN ENACARBIL ER 600 MG TABLET,EXTENDED RELEASE
600 mg | Freq: Two times a day (BID) | ORAL | Status: DC
Start: 2019-04-12 — End: 2019-04-13

## 2019-04-12 MED ORDER — DOCUSATE SODIUM 100 MG CAP
100 mg | Freq: Two times a day (BID) | ORAL | Status: DC
Start: 2019-04-12 — End: 2019-04-13
  Administered 2019-04-12 – 2019-04-13 (×4): via ORAL

## 2019-04-12 MED ORDER — SODIUM CHLORIDE 0.9 % IJ SYRG
INTRAMUSCULAR | Status: DC | PRN
Start: 2019-04-12 — End: 2019-04-13

## 2019-04-12 MED ORDER — LORAZEPAM 1 MG TAB
1 mg | ORAL | Status: AC
Start: 2019-04-12 — End: 2019-04-12
  Administered 2019-04-12: 19:00:00 via ORAL

## 2019-04-12 MED ORDER — LORAZEPAM 1 MG TAB
1 mg | Freq: Two times a day (BID) | ORAL | Status: DC | PRN
Start: 2019-04-12 — End: 2019-04-12

## 2019-04-12 MED ORDER — LABETALOL 100 MG TAB
100 mg | Freq: Four times a day (QID) | ORAL | Status: DC | PRN
Start: 2019-04-12 — End: 2019-04-13

## 2019-04-12 MED ORDER — DIPHENHYDRAMINE 25 MG CAP
25 mg | ORAL | Status: DC | PRN
Start: 2019-04-12 — End: 2019-04-13

## 2019-04-12 MED ORDER — IBUPROFEN 200 MG TAB
200 mg | ORAL | Status: DC | PRN
Start: 2019-04-12 — End: 2019-04-13
  Administered 2019-04-13 (×2): via ORAL

## 2019-04-12 MED ORDER — D5-1/2 NS & POTASSIUM CHLORIDE 20 MEQ/L IV
20 mEq/L | INTRAVENOUS | Status: DC
Start: 2019-04-12 — End: 2019-04-12
  Administered 2019-04-12: 03:00:00 via INTRAVENOUS

## 2019-04-12 MED ORDER — SODIUM CHLORIDE 0.9 % IJ SYRG
Freq: Three times a day (TID) | INTRAMUSCULAR | Status: DC
Start: 2019-04-12 — End: 2019-04-13
  Administered 2019-04-12 – 2019-04-13 (×5): via INTRAVENOUS

## 2019-04-12 MED ORDER — TAPENTADOL 50 MG TAB
50 mg | Freq: Two times a day (BID) | ORAL | Status: DC | PRN
Start: 2019-04-12 — End: 2019-04-13
  Administered 2019-04-12 – 2019-04-13 (×2): via ORAL

## 2019-04-12 MED ORDER — ENALAPRILAT 1.25 MG/ML IV INJ
1.25 mg/mL | Freq: Four times a day (QID) | INTRAVENOUS | Status: DC | PRN
Start: 2019-04-12 — End: 2019-04-13

## 2019-04-12 MED ORDER — CEFAZOLIN 2 GRAM/20 ML IN STERILE WATER INTRAVENOUS SYRINGE
2 gram/0 mL | Freq: Three times a day (TID) | INTRAVENOUS | Status: AC
Start: 2019-04-12 — End: 2019-04-12
  Administered 2019-04-12 (×3): via INTRAVENOUS

## 2019-04-12 MED ORDER — ESZOPICLONE 2 MG TAB
2 mg | Freq: Every evening | ORAL | Status: DC
Start: 2019-04-12 — End: 2019-04-13

## 2019-04-12 MED ORDER — DULOXETINE 30 MG CAP, DELAYED RELEASE
30 mg | Freq: Two times a day (BID) | ORAL | Status: DC
Start: 2019-04-12 — End: 2019-04-13
  Administered 2019-04-12 – 2019-04-13 (×4): via ORAL

## 2019-04-12 MED ORDER — LORAZEPAM 1 MG TAB
1 mg | Freq: Four times a day (QID) | ORAL | Status: DC | PRN
Start: 2019-04-12 — End: 2019-04-13

## 2019-04-12 MED ORDER — BUSPIRONE 5 MG TAB
5 mg | Freq: Three times a day (TID) | ORAL | Status: DC
Start: 2019-04-12 — End: 2019-04-13
  Administered 2019-04-12 – 2019-04-13 (×4): via ORAL

## 2019-04-12 MED ORDER — NALOXONE 0.4 MG/ML INJECTION
0.4 mg/mL | INTRAMUSCULAR | Status: DC | PRN
Start: 2019-04-12 — End: 2019-04-13

## 2019-04-12 MED ORDER — HYDROMORPHONE (PF) 1 MG/ML IJ SOLN
1 mg/mL | INTRAMUSCULAR | Status: DC | PRN
Start: 2019-04-12 — End: 2019-04-13
  Administered 2019-04-12 (×2): via INTRAVENOUS

## 2019-04-12 MED ORDER — ONDANSETRON 4 MG TAB, RAPID DISSOLVE
4 mg | ORAL | Status: DC | PRN
Start: 2019-04-12 — End: 2019-04-13

## 2019-04-12 MED FILL — HYDROMORPHONE (PF) 1 MG/ML IJ SOLN: 1 mg/mL | INTRAMUSCULAR | Qty: 1

## 2019-04-12 MED FILL — CARVEDILOL 12.5 MG TAB: 12.5 mg | ORAL | Qty: 1

## 2019-04-12 MED FILL — ALCOHOL 62% (NOZIN) NASAL SANITIZER: 62 % | CUTANEOUS | Qty: 1

## 2019-04-12 MED FILL — DULOXETINE 30 MG CAP, DELAYED RELEASE: 30 mg | ORAL | Qty: 1

## 2019-04-12 MED FILL — D5-1/2 NS & POTASSIUM CHLORIDE 20 MEQ/L IV: 20 mEq/L | INTRAVENOUS | Qty: 1000

## 2019-04-12 MED FILL — DOCUSATE SODIUM 100 MG CAP: 100 mg | ORAL | Qty: 1

## 2019-04-12 MED FILL — LORAZEPAM 1 MG TAB: 1 mg | ORAL | Qty: 1

## 2019-04-12 MED FILL — CEFAZOLIN 100 GRAM SOLUTION FOR INJECTION: 100 gram | INTRAMUSCULAR | Qty: 2

## 2019-04-12 MED FILL — BUSPIRONE 5 MG TAB: 5 mg | ORAL | Qty: 2

## 2019-04-12 MED FILL — OXYCODONE 5 MG TAB: 5 mg | ORAL | Qty: 1

## 2019-04-12 MED FILL — NUCYNTA 50 MG TABLET: 50 mg | ORAL | Qty: 1

## 2019-04-12 NOTE — Progress Notes (Signed)
Problem: Falls - Risk of  Goal: *Absence of Falls  Description: Document Schmid Fall Risk and appropriate interventions in the flowsheet.  Outcome: Progressing Towards Goal  Note: Fall Risk Interventions:  Mobility Interventions: Communicate number of staff needed for ambulation/transfer, Patient to call before getting OOB         Medication Interventions: Bed/chair exit alarm, Evaluate medications/consider consulting pharmacy, Patient to call before getting OOB, Teach patient to arise slowly         History of Falls Interventions: Evaluate medications/consider consulting pharmacy         Problem: Patient Education: Go to Patient Education Activity  Goal: Patient/Family Education  Outcome: Progressing Towards Goal

## 2019-04-12 NOTE — Progress Notes (Signed)
ORTHO PROGRESS NOTE    April 12, 2019    Admit Date: 04/11/2019  Admit Diagnosis: Cervical spinal stenosis [M48.02];Cervical herniation [M50.20];Cervical herniated disc [M50.20]  Post Op day: 1 Day Post-Op      Subjective:     Kristin [name] is a patient who is now 1 Day Post-Op  and has no complaints.       Objective:     PT/OT:    Pending    Vital Signs:    Patient Vitals for the past 8 hrs:   BP Temp Pulse Resp SpO2   04/12/19 0752 (!) 150/96 97.3 ??F (36.3 ??C) 93 20 98 %   04/12/19 0557 (!) 164/109 98.5 ??F (36.9 ??C) (!) 106 18 100 %     Temp (24hrs), Avg:97.9 ??F (36.6 ??C), Min:97.3 ??F (36.3 ??C), Max:98.6 ??F (37 ??C)      LAB:    No results for input(s): HGB, WBC, PLT, HGBEXT, PLTEXT in the last 72 hours.    I/O:  No intake/output data recorded.  02/28 1901 - 03/02 0700  In: 1500 [I.V.:1500]  Out: 10     Physical Exam:    Awake and in no acute distress.  Mood and affect appropriate.  Respirations unlabored and no evidence cyanosis.  Calves nontender.  Abdomen soft and nontender.  Dressing clean/dry  No new neurologic deficit.    Assessment:      Patient Active Problem List   Diagnosis Code   ??? 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 Acquired spondylolisthesis M43.10   ??? Cervical herniated disc M50.20       1 Day Post-Op STATUS POST Procedure(s):  C4-C5 SPINE ANTERIOR CERVICAL FUSION WITH INSTRUMENTATION NEED EXTRA TECH      Plan:       Anticipate discharge to: HOME today      Signed By: Jerald Hennington D Van Pelt, MD

## 2019-04-12 NOTE — Progress Notes (Addendum)
Hourly rounds complete this shift, no new complaints at this time, PRN pain medication given per mar, B/P 164/109 ortho and hospitalist consulted with no new orders at this time. : bed in low, locked position, call light and bedside table within reach,  all needs met. Will continue to monitor Report to day shift nurse. Hospitalist consulted per Michael McInnis for elevated B/P patient states she also has a history of kidney stones.

## 2019-04-12 NOTE — Consults (Signed)
Hospitalist Consult Note     Admit Date:  04/11/2019 12:11 PM   Name:  Kristin [name]   Age:  59 y.o.  DOB:  Aug 14, 1960   MRN:  [id number]   PCP:  Sol Blazing, MD  Treatment Team: Attending Provider: Wandra Mannan, MD; Staff Nurse: Verner Chol E, RN; Utilization Review: Oswaldo Conroy, RN; Consulting Provider: Gloriann Loan, MD; Primary Nurse: Scherrie Gerlach, RN; Care Manager: Janora Norlander    Hospitalists consulted by ortho for: HTN    HPI:   Pt is a 59 y/o F with anxiety, depression, who was admitted by ortho service for c4-5 fusion, done 3/1.Marland Kitchen  Postop she developed uncontrolled HTN,  Pt says her BP is usually well controlled at home.  She reports significant pain since surgery as well as anxiety.  She does not take any BP meds at home.  Takes buspar for anxiety which has not helped here.  No vision changes, HA, CP, SOB.    10 systems reviewed and negative except as noted in HPI.  Assessment and Plan:     Hospital Problems as of 05-11-2019 Date Reviewed: May 11, 2019          Codes Class Noted - Resolved POA    Anxiety (Chronic) ICD-10-CM: F41.9  ICD-9-CM: 300.00  11-May-2019 - Present Yes        Other secondary hypertension ICD-10-CM: I15.8  ICD-9-CM: 405.99  2019-05-11 - Present Yes        * (Principal) Cervical herniated disc ICD-10-CM: M50.20  ICD-9-CM: 722.0  04/11/2019 - Present Yes        Acquired spondylolisthesis ICD-10-CM: M43.10  ICD-9-CM: 738.4  09/02/2011 - Present Yes              Plan:  HTN  -due to anxiety and pain I suspect  -will do coreg for now but may not need when she leaves  -PRNs ordered as shown also  -check CBC, CMP in AM, no labs yet this admission    Anxiety  -tearful and visibly anxious during interview.  -give ativan 1mg  now and q6h PRN    Other listed chronic conditions stable, continue current management.    Past Medical History:   Diagnosis Date   ??? Asthma     as a child   ??? Chronic kidney disease     kidney stones   ??? Chronic pain     back   ??? Depression    ??? Hypertension      no meds, borderline   ??? Osteoarthritis       Past Surgical History:   Procedure Laterality Date   ??? HX CATARACT REMOVAL Bilateral 2020   ??? HX CESAREAN SECTION  1992   ??? HX GYN      tubal, cyst removed from left ovary, C section times 1   ??? HX HEENT      tonsills   ??? HX LUMBAR FUSION      L3 L4   ??? HX ORTHOPAEDIC      cyst removed both hands   ??? NEUROLOGICAL PROCEDURE UNLISTED      L4L5 spinal fussion   ??? PR ABDOMEN SURGERY PROC UNLISTED      lap chole      Allergies   Allergen Reactions   ??? Aspirin Nausea Only   ??? Celebrex [Celecoxib] Nausea Only      Social History     Tobacco Use   ??? Smoking status: Never Smoker   ??? Smokeless  tobacco: Never Used   Substance Use Topics   ??? Alcohol use: Yes     Comment: rare      Family History   Problem Relation Age of Onset   ??? Heart Disease Mother    ??? Cancer Mother    ??? Diabetes Father    ??? Malignant Hyperthermia Neg Hx    ??? Pseudocholinesterase Deficiency Neg Hx    ??? Delayed Awakening Neg Hx    ??? Post-op Nausea/Vomiting Neg Hx    ??? Emergence Delirium Neg Hx    ??? Post-op Cognitive Dysfunction Neg Hx    ??? Other Neg Hx       Family history reviewed and noncontributory to patient's acute condition.    There is no immunization history on file for this patient.    Objective:     Patient Vitals for the past 24 hrs:   Temp Pulse Resp BP SpO2   04/12/19 1235 98.2 ??F (36.8 ??C) 90 18 (!) 145/89 97 %   04/12/19 0752 97.3 ??F (36.3 ??C) 93 20 (!) 150/96 98 %   04/12/19 0557 98.5 ??F (36.9 ??C) (!) 106 18 (!) 164/109 100 %   04/11/19 2352 98.3 ??F (36.8 ??C) 92 18 (!) 157/88 100 %   04/11/19 1944 ??? 72 18 (!) 149/72 100 %   04/11/19 1939 ??? 72 18 (!) 159/75 100 %   04/11/19 1934 97.5 ??F (36.4 ??C) 75 18 (!) 162/75 100 %   04/11/19 1931 ??? 73 18 (!) 177/85 100 %   04/11/19 1925 ??? 69 18 (!) 180/81 100 %   04/11/19 1915 ??? 74 18 (!) 175/79 100 %   04/11/19 1909 ??? 67 16 (!) 162/73 100 %   04/11/19 1905 ??? 68 18 (!) 160/71 100 %   04/11/19 1900 ??? 69 16 (!) 164/73 100 %    04/11/19 1854 ??? 71 18 (!) 170/75 100 %   04/11/19 1849 ??? 75 16 (!) 161/72 100 %   04/11/19 1844 ??? 72 16 (!) 164/73 100 %   04/11/19 1841 ??? 71 16 (!) 169/78 100 %   04/11/19 1837 97.3 ??F (36.3 ??C) 88 15 (!) 172/77 97 %   04/11/19 1835 ??? 75 16 (!) 172/77 99 %     Oxygen Therapy  O2 Sat (%): 97 % (04/12/19 1235)  Pulse via Oximetry: 73 beats per minute (04/11/19 1944)  O2 Device: Nasal cannula (04/11/19 1934)  O2 Flow Rate (L/min): 2 l/min (04/11/19 1934)    Estimated body mass index is 32.81 kg/m?? as calculated from the following:    Height as of this encounter: 5' (1.524 m).    Weight as of this encounter: 76.2 kg (168 lb).    Intake/Output Summary (Last 24 hours) at 04/12/2019 1441  Last data filed at 04/12/2019 1021  Gross per 24 hour   Intake 1500 ml   Output 1010 ml   Net 490 ml       *Note that automatically entered I/Os may not be accurate; dependent on patient compliance with collection and accurate data entry by assistants.  Physical Exam:  General:    Well nourished.  No overt distress.    Eyes:   Normal sclerae.  Extraocular movements intact.  HENT:  Normocephalic, atraumatic.  Moist mucous membranes  CV:   RRR.   No edema  Lungs:  Even, Unlabored  Abdomen: nondistended.   Extremities: Warm and dry.  No cyanosis or clubbing  Neurologic: CN II-XII grossly intact.  Sensation intact.  Skin:     No rashes.  No jaundice.  Normal coloration  Psych:  Anxious, tearful    I reviewed the labs, imaging, EKGs, telemetry, and other studies done this admission.  Data Reviewed:   No results found for this or any previous visit (from the past 24 hour(s)).    All Micro Results     None          SARS-CoV-2 Lab Results  "Novel Coronavirus" Test: No results found for: COV2NT   "Emergent Disease" Test: No results found for: EDPR  "SARS-COV-2" Test: No results found for: XGCOVT  Rapid Test: No results found for: COVR         Other Studies:  No results found for this visit on 04/11/19.    Xr Spine Cerv Pa Lat Odont 3 V Max     Result Date: 04/11/2019  Exam: 2 views of the cervical spine. Indication: Intraoperative radiographs. COMPARISON: None. Fluoroscopy time: 26 seconds. Fluoroscopic dose: 2.481 mGy. Findings: 3 intraoperative radiographs are submitted with first radiograph revealing a metallic probe at the level of the C5-C6 disc space anteriorly with a subsequent C4-C5 ACDF with disc spacer without evidence of immediate complication.     1. Intraoperative films without evidence of immediate complication.    Current Facility-Administered Medications   Medication Dose Route Frequency   ??? enalaprilat (VASOTEC) injection 1.25 mg  1.25 mg IntraVENous Q6H PRN   ??? labetaloL (NORMODYNE) tablet 100 mg  100 mg Oral QID PRN   ??? carvediloL (COREG) tablet 12.5 mg  12.5 mg Oral BID WITH MEALS   ??? LORazepam (ATIVAN) tablet 1 mg  1 mg Oral Q6H PRN   ??? DULoxetine (CYMBALTA) capsule 30 mg  30 mg Oral BID   ??? eszopiclone (LUNESTA) tablet 2 mg (Patient Supplied)  2 mg Oral QHS   ??? gabapentin enacarbil TbER 600 mg (Patient Supplied)  600 mg Oral BID   ??? tapentadoL (NUCYNTA) tablet 50 mg  50 mg Oral Q12H PRN   ??? busPIRone (BUSPAR) tablet 7.5 mg  7.5 mg Oral TID   ??? alcohol 62% (NOZIN) nasal sanitizer 1 Ampule  1 Ampule Topical Q12H   ??? sodium chloride (NS) flush 5-40 mL  5-40 mL IntraVENous Q8H   ??? sodium chloride (NS) flush 5-40 mL  5-40 mL IntraVENous PRN   ??? naloxone (NARCAN) injection 0.4 mg  0.4 mg IntraVENous PRN   ??? phenol throat spray (CHLORASEPTIC) 1 Spray  1 Spray Oral PRN   ??? diphenhydrAMINE (BENADRYL) capsule 25 mg  25 mg Oral Q4H PRN   ??? docusate sodium (COLACE) capsule 100 mg  100 mg Oral BID   ??? ibuprofen (MOTRIN) tablet 400 mg  400 mg Oral Q4H PRN   ??? HYDROmorphone (PF) (DILAUDID) injection 1 mg  1 mg IntraVENous Q4H PRN   ??? ondansetron (ZOFRAN ODT) tablet 4 mg  4 mg Oral Q4H PRN         Signed:  Gloriann Loan, MD

## 2019-04-12 NOTE — Progress Notes (Signed)
CM met with patient, with the patient's daughter Jennifer present at bedside, to discuss discharge needs. Demographic information verified by the patient. The patient and her daughter Jennifer live in a one story home with 3 steps at the entrance. The patient resides in [place].  Patient confirmed she is independent with completing all ADL's and drives.    DME: None    Patient receives her prescription medications in [place]. Patient voiced no difficulty with obtaining medications in the community.    Discharge planning: PT/OT has not been consulted. Patient confirmed a history of REHAB in the past in [place]. Patient denies any history of HH. Patient to return to hotel at time of discharge, as she is here visiting. No needs voiced by the patient at this time. Please consult or notify CM if any needs shall arise. CM remains available.    Care Management Interventions  PCP Verified by CM: Yes(Yes PCP is located in [place]. )  Mode of Transport at Discharge: Other (see comment)(Daughter- Jennifer [phone].)  Transition of Care Consult (CM Consult): Discharge Planning  Discharge Durable Medical Equipment: No  Physical Therapy Consult: No  Occupational Therapy Consult: No  Speech Therapy Consult: No  Current Support Network: Own Home  Confirm Follow Up Transport: Self  The Plan for Transition of Care is Related to the Following Treatment Goals : Return to Baseline.   The Patient and/or Patient Representative was Provided with a Choice of Provider and Agrees with the Discharge Plan?: Yes  Name of the Patient Representative Who was Provided with a Choice of Provider and Agrees with the Discharge Plan: Patient.   Veteran Resource Information Provided?: No  Discharge Location  Discharge Placement: Other:(Hotel (Patient Visiting from [place]))

## 2019-04-12 NOTE — Progress Notes (Signed)
Hourly rounds complete this shift, no new complaints at this time, PRN pain medication given per mar, B/P 164/109 ortho and hospitalist consulted with no new orders at this time. : bed in low, locked position, call light and bedside table within reach,  all needs met. Will continue to monitor Report to day shift nurse. Hospitalist consulted per Lennice Sites for elevated B/P patient states she also has a history of kidney stones.

## 2019-04-12 NOTE — Progress Notes (Signed)
CM met with patient, with the patient's daughter Kristin [name] present at bedside, to discuss discharge needs. Demographic information verified by the patient. The patient and her daughter Kristin [name] live in a one story home with 3 steps at the entrance. The patient resides in [place].  Patient confirmed she is independent with completing all ADL's and drives.    DME: None    Patient receives her prescription medications in [place]. Patient voiced no difficulty with obtaining medications in the community.    Discharge planning: PT/OT has not been consulted. Patient confirmed a history of REHAB in the past in [place]. Patient denies any history of HH. Patient to return to hotel at time of discharge, as she is here visiting. No needs voiced by the patient at this time. Please consult or notify CM if any needs shall arise. CM remains available.    Care Management Interventions  PCP Verified by CM: Yes(Yes PCP is located in [place]. )  Mode of Transport at Discharge: Other (see comment)(Daughter- Kristin [name] [phone].)  Transition of Care Consult (CM Consult): Discharge Planning  Discharge Durable Medical Equipment: No  Physical Therapy Consult: No  Occupational Therapy Consult: No  Speech Therapy Consult: No  Current Support Network: Own Home  Confirm Follow Up Transport: Self  The Plan for Transition of Care is Related to the Following Treatment Goals : Return to Baseline.   The Patient and/or Patient Representative was Provided with a Choice of Provider and Agrees with the Discharge Plan?: Yes  Name of the Patient Representative Who was Provided with a Choice of Provider and Agrees with the Discharge Plan: Patient.   Veteran Resource Information Provided?: No  Discharge Location  Discharge Placement: Other:(Hotel (Patient Visiting from [facility]))

## 2019-04-12 NOTE — Progress Notes (Signed)
ORTHO PROGRESS NOTE    April 12, 2019    Admit Date: 04/11/2019  Admit Diagnosis: Cervical spinal stenosis [M48.02];Cervical herniation [M50.20];Cervical herniated disc [M50.20]  Post Op day: 1 Day Post-Op      Subjective:     Kristin [name] is a patient who is now 1 Day Post-Op  and has no complaints.       Objective:     PT/OT:    Pending    Vital Signs:    Patient Vitals for the past 8 hrs:   BP Temp Pulse Resp SpO2   04/12/19 0752 (!) 150/96 97.3 ??F (36.3 ??C) 93 20 98 %   04/12/19 0557 (!) 164/109 98.5 ??F (36.9 ??C) (!) 106 18 100 %     Temp (24hrs), Avg:97.9 ??F (36.6 ??C), Min:97.3 ??F (36.3 ??C), Max:98.6 ??F (37 ??C)      LAB:    No results for input(s): HGB, WBC, PLT, HGBEXT, PLTEXT in the last 72 hours.    I/O:  No intake/output data recorded.  02/28 1901 - 03/02 0700  In: 1500 [I.V.:1500]  Out: 10     Physical Exam:    Awake and in no acute distress.  Mood and affect appropriate.  Respirations unlabored and no evidence cyanosis.  Calves nontender.  Abdomen soft and nontender.  Dressing clean/dry  No new neurologic deficit.    Assessment:      Patient Active Problem List   Diagnosis Code   ??? 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 Acquired spondylolisthesis M43.10   ??? Cervical herniated disc M50.20       1 Day Post-Op STATUS POST Procedure(s):  C4-C5 SPINE ANTERIOR CERVICAL FUSION WITH INSTRUMENTATION NEED EXTRA [facility]      Plan:       Anticipate discharge to: HOME today      Signed By: Reece Packer, MD

## 2019-04-12 NOTE — Consults (Signed)
Hospitalist Consult Note     Admit Date:  04/11/2019 12:11 PM   Name:  Kristin [name]   Age:  59 y.o.  DOB:  Aug 14, 1960   MRN:  [id number]   PCP:  Sol Blazing, MD  Treatment Team: Attending Provider: Wandra Mannan, MD; Staff Nurse: Verner Chol E, RN; Utilization Review: Oswaldo Conroy, RN; Consulting Provider: Gloriann Loan, MD; Primary Nurse: Scherrie Gerlach, RN; Care Manager: Janora Norlander    Hospitalists consulted by ortho for: HTN    HPI:   Pt is a 59 y/o F with anxiety, depression, who was admitted by ortho service for c4-5 fusion, done 3/1.Marland Kitchen  Postop she developed uncontrolled HTN,  Pt says her BP is usually well controlled at home.  She reports significant pain since surgery as well as anxiety.  She does not take any BP meds at home.  Takes buspar for anxiety which has not helped here.  No vision changes, HA, CP, SOB.    10 systems reviewed and negative except as noted in HPI.  Assessment and Plan:     Hospital Problems as of 05-11-2019 Date Reviewed: May 11, 2019          Codes Class Noted - Resolved POA    Anxiety (Chronic) ICD-10-CM: F41.9  ICD-9-CM: 300.00  11-May-2019 - Present Yes        Other secondary hypertension ICD-10-CM: I15.8  ICD-9-CM: 405.99  2019-05-11 - Present Yes        * (Principal) Cervical herniated disc ICD-10-CM: M50.20  ICD-9-CM: 722.0  04/11/2019 - Present Yes        Acquired spondylolisthesis ICD-10-CM: M43.10  ICD-9-CM: 738.4  09/02/2011 - Present Yes              Plan:  HTN  -due to anxiety and pain I suspect  -will do coreg for now but may not need when she leaves  -PRNs ordered as shown also  -check CBC, CMP in AM, no labs yet this admission    Anxiety  -tearful and visibly anxious during interview.  -give ativan 1mg  now and q6h PRN    Other listed chronic conditions stable, continue current management.    Past Medical History:   Diagnosis Date   ??? Asthma     as a child   ??? Chronic kidney disease     kidney stones   ??? Chronic pain     back   ??? Depression    ??? Hypertension      no meds, borderline   ??? Osteoarthritis       Past Surgical History:   Procedure Laterality Date   ??? HX CATARACT REMOVAL Bilateral 2020   ??? HX CESAREAN SECTION  1992   ??? HX GYN      tubal, cyst removed from left ovary, C section times 1   ??? HX HEENT      tonsills   ??? HX LUMBAR FUSION      L3 L4   ??? HX ORTHOPAEDIC      cyst removed both hands   ??? NEUROLOGICAL PROCEDURE UNLISTED      L4L5 spinal fussion   ??? PR ABDOMEN SURGERY PROC UNLISTED      lap chole      Allergies   Allergen Reactions   ??? Aspirin Nausea Only   ??? Celebrex [Celecoxib] Nausea Only      Social History     Tobacco Use   ??? Smoking status: Never Smoker   ??? Smokeless  tobacco: Never Used   Substance Use Topics   ??? Alcohol use: Yes     Comment: rare      Family History   Problem Relation Age of Onset   ??? Heart Disease Mother    ??? Cancer Mother    ??? Diabetes Father    ??? Malignant Hyperthermia Neg Hx    ??? Pseudocholinesterase Deficiency Neg Hx    ??? Delayed Awakening Neg Hx    ??? Post-op Nausea/Vomiting Neg Hx    ??? Emergence Delirium Neg Hx    ??? Post-op Cognitive Dysfunction Neg Hx    ??? Other Neg Hx       Family history reviewed and noncontributory to patient's acute condition.    There is no immunization history on file for this patient.    Objective:     Patient Vitals for the past 24 hrs:   Temp Pulse Resp BP SpO2   04/12/19 1235 98.2 ??F (36.8 ??C) 90 18 (!) 145/89 97 %   04/12/19 0752 97.3 ??F (36.3 ??C) 93 20 (!) 150/96 98 %   04/12/19 0557 98.5 ??F (36.9 ??C) (!) 106 18 (!) 164/109 100 %   04/11/19 2352 98.3 ??F (36.8 ??C) 92 18 (!) 157/88 100 %   04/11/19 1944 ??? 72 18 (!) 149/72 100 %   04/11/19 1939 ??? 72 18 (!) 159/75 100 %   04/11/19 1934 97.5 ??F (36.4 ??C) 75 18 (!) 162/75 100 %   04/11/19 1931 ??? 73 18 (!) 177/85 100 %   04/11/19 1925 ??? 69 18 (!) 180/81 100 %   04/11/19 1915 ??? 74 18 (!) 175/79 100 %   04/11/19 1909 ??? 67 16 (!) 162/73 100 %   04/11/19 1905 ??? 68 18 (!) 160/71 100 %   04/11/19 1900 ??? 69 16 (!) 164/73 100 %   04/11/19 1854 ??? 71 18 (!) 170/75 100  %   04/11/19 1849 ??? 75 16 (!) 161/72 100 %   04/11/19 1844 ??? 72 16 (!) 164/73 100 %   04/11/19 1841 ??? 71 16 (!) 169/78 100 %   04/11/19 1837 97.3 ??F (36.3 ??C) 88 15 (!) 172/77 97 %   04/11/19 1835 ??? 75 16 (!) 172/77 99 %     Oxygen Therapy  O2 Sat (%): 97 % (04/12/19 1235)  Pulse via Oximetry: 73 beats per minute (04/11/19 1944)  O2 Device: Nasal cannula (04/11/19 1934)  O2 Flow Rate (L/min): 2 l/min (04/11/19 1934)    Estimated body mass index is 32.81 kg/m?? as calculated from the following:    Height as of this encounter: 5' (1.524 m).    Weight as of this encounter: 76.2 kg (168 lb).    Intake/Output Summary (Last 24 hours) at 04/12/2019 1441  Last data filed at 04/12/2019 1021  Gross per 24 hour   Intake 1500 ml   Output 1010 ml   Net 490 ml       *Note that automatically entered I/Os may not be accurate; dependent on patient compliance with collection and accurate data entry by assistants.  Physical Exam:  General:    Well nourished.  No overt distress.    Eyes:   Normal sclerae.  Extraocular movements intact.  HENT:  Normocephalic, atraumatic.  Moist mucous membranes  CV:   RRR.   No edema  Lungs:  Even, Unlabored  Abdomen: nondistended.   Extremities: Warm and dry.  No cyanosis or clubbing  Neurologic: CN II-XII grossly intact.  Sensation intact.  Skin:     No rashes.  No jaundice.  Normal coloration  Psych:  Anxious, tearful    I reviewed the labs, imaging, EKGs, telemetry, and other studies done this admission.  Data Reviewed:   No results found for this or any previous visit (from the past 24 hour(s)).    All Micro Results     None          SARS-CoV-2 Lab Results  "Novel Coronavirus" Test: No results found for: COV2NT   "Emergent Disease" Test: No results found for: EDPR  "SARS-COV-2" Test: No results found for: XGCOVT  Rapid Test: No results found for: COVR         Other Studies:  No results found for this visit on 04/11/19.    Xr Spine Cerv Pa Lat Odont 3 V Max    Result Date: 04/11/2019  Exam: 2 views of the  cervical spine. Indication: Intraoperative radiographs. COMPARISON: None. Fluoroscopy time: 26 seconds. Fluoroscopic dose: 2.481 mGy. Findings: 3 intraoperative radiographs are submitted with first radiograph revealing a metallic probe at the level of the C5-C6 disc space anteriorly with a subsequent C4-C5 ACDF with disc spacer without evidence of immediate complication.     1. Intraoperative films without evidence of immediate complication.    Current Facility-Administered Medications   Medication Dose Route Frequency   ??? enalaprilat (VASOTEC) injection 1.25 mg  1.25 mg IntraVENous Q6H PRN   ??? labetaloL (NORMODYNE) tablet 100 mg  100 mg Oral QID PRN   ??? carvediloL (COREG) tablet 12.5 mg  12.5 mg Oral BID WITH MEALS   ??? LORazepam (ATIVAN) tablet 1 mg  1 mg Oral Q6H PRN   ??? DULoxetine (CYMBALTA) capsule 30 mg  30 mg Oral BID   ??? eszopiclone (LUNESTA) tablet 2 mg (Patient Supplied)  2 mg Oral QHS   ??? gabapentin enacarbil TbER 600 mg (Patient Supplied)  600 mg Oral BID   ??? tapentadoL (NUCYNTA) tablet 50 mg  50 mg Oral Q12H PRN   ??? busPIRone (BUSPAR) tablet 7.5 mg  7.5 mg Oral TID   ??? alcohol 62% (NOZIN) nasal sanitizer 1 Ampule  1 Ampule Topical Q12H   ??? sodium chloride (NS) flush 5-40 mL  5-40 mL IntraVENous Q8H   ??? sodium chloride (NS) flush 5-40 mL  5-40 mL IntraVENous PRN   ??? naloxone (NARCAN) injection 0.4 mg  0.4 mg IntraVENous PRN   ??? phenol throat spray (CHLORASEPTIC) 1 Spray  1 Spray Oral PRN   ??? diphenhydrAMINE (BENADRYL) capsule 25 mg  25 mg Oral Q4H PRN   ??? docusate sodium (COLACE) capsule 100 mg  100 mg Oral BID   ??? ibuprofen (MOTRIN) tablet 400 mg  400 mg Oral Q4H PRN   ??? HYDROmorphone (PF) (DILAUDID) injection 1 mg  1 mg IntraVENous Q4H PRN   ??? ondansetron (ZOFRAN ODT) tablet 4 mg  4 mg Oral Q4H PRN         Signed:  Gloriann Loan, MD

## 2019-04-12 NOTE — Progress Notes (Signed)
Problem: Falls - Risk of  Goal: *Absence of Falls  Description: Document Kristin [name] Fall Risk and appropriate interventions in the flowsheet.  Outcome: Progressing Towards Goal  Note: Fall Risk Interventions:  Mobility Interventions: Communicate number of staff needed for ambulation/transfer, Patient to call before getting OOB         Medication Interventions: Bed/chair exit alarm, Evaluate medications/consider consulting pharmacy, Patient to call before getting OOB, Teach patient to arise slowly         History of Falls Interventions: Evaluate medications/consider consulting pharmacy         Problem: Patient Education: Go to Patient Education Activity  Goal: Patient/Family Education  Outcome: Progressing Towards Goal

## 2019-04-13 DIAGNOSIS — M5020 Other cervical disc displacement, unspecified cervical region: Secondary | ICD-10-CM | POA: Diagnosis not present

## 2019-04-13 DIAGNOSIS — E669 Obesity, unspecified: Secondary | ICD-10-CM | POA: Diagnosis not present

## 2019-04-13 DIAGNOSIS — I158 Other secondary hypertension: Secondary | ICD-10-CM | POA: Diagnosis not present

## 2019-04-13 DIAGNOSIS — F419 Anxiety disorder, unspecified: Secondary | ICD-10-CM | POA: Diagnosis not present

## 2019-04-13 DIAGNOSIS — M4802 Spinal stenosis, cervical region: Secondary | ICD-10-CM | POA: Diagnosis not present

## 2019-04-13 DIAGNOSIS — M1990 Unspecified osteoarthritis, unspecified site: Secondary | ICD-10-CM | POA: Diagnosis not present

## 2019-04-13 DIAGNOSIS — Z6832 Body mass index (BMI) 32.0-32.9, adult: Secondary | ICD-10-CM | POA: Diagnosis not present

## 2019-04-13 DIAGNOSIS — I10 Essential (primary) hypertension: Secondary | ICD-10-CM | POA: Diagnosis not present

## 2019-04-13 DIAGNOSIS — F329 Major depressive disorder, single episode, unspecified: Secondary | ICD-10-CM | POA: Diagnosis not present

## 2019-04-13 LAB — COMPREHENSIVE METABOLIC PANEL
ALT: 23 U/L (ref 12–65)
AST: 29 U/L (ref 15–37)
Albumin/Globulin Ratio: 0.9 — ABNORMAL LOW (ref 1.2–3.5)
Albumin: 3 g/dL — ABNORMAL LOW (ref 3.5–5.0)
Alkaline Phosphatase: 111 U/L (ref 50–136)
Anion Gap: 5 mmol/L — ABNORMAL LOW (ref 7–16)
BUN: 14 MG/DL (ref 6–23)
CO2: 29 mmol/L (ref 21–32)
Calcium: 8.6 MG/DL (ref 8.3–10.4)
Chloride: 107 mmol/L (ref 98–107)
Creatinine: 0.6 MG/DL (ref 0.6–1.0)
EGFR IF NonAfrican American: >60 mL/min/{1.73_m2} (ref >60)
GFR African American: >60 mL/min/{1.73_m2} (ref >60)
Globulin: 3.2 g/dL (ref 2.3–3.5)
Glucose: 106 mg/dL — ABNORMAL HIGH (ref 65–100)
Potassium: 3.8 mmol/L (ref 3.5–5.1)
Sodium: 141 mmol/L (ref 136–145)
Total Bilirubin: 0.6 MG/DL (ref 0.2–1.1)
Total Protein: 6.2 g/dL — ABNORMAL LOW (ref 6.3–8.2)

## 2019-04-13 LAB — CBC
Hematocrit: 39.1 % (ref 35.8–46.3)
Hemoglobin: 12.6 g/dL (ref 11.7–15.4)
MCH: 29.6 PG (ref 26.1–32.9)
MCHC: 32.2 g/dL (ref 31.4–35.0)
MCV: 92 FL (ref 79.6–97.8)
MPV: 11.1 FL (ref 9.4–12.3)
NRBC Absolute: 0 10*3/uL (ref 0.0–0.2)
Platelets: 253 10*3/uL (ref 150–450)
RBC: 4.25 M/uL (ref 4.05–5.2)
RDW: 14.3 % (ref 11.9–14.6)
WBC: 12.6 10*3/uL — ABNORMAL HIGH (ref 4.3–11.1)

## 2019-04-13 LAB — CBC W/O DIFF
ABSOLUTE NRBC: 0 10*3/uL (ref 0.0–0.2)
HCT: 39.1 % (ref 35.8–46.3)
HGB: 12.6 g/dL (ref 11.7–15.4)
MCH: 29.6 PG (ref 26.1–32.9)
MCHC: 32.2 g/dL (ref 31.4–35.0)
MCV: 92 FL (ref 79.6–97.8)
MPV: 11.1 FL (ref 9.4–12.3)
PLATELET: 253 10*3/uL (ref 150–450)
RBC: 4.25 M/uL (ref 4.05–5.2)
RDW: 14.3 % (ref 11.9–14.6)
WBC: 12.6 10*3/uL — ABNORMAL HIGH (ref 4.3–11.1)

## 2019-04-13 LAB — METABOLIC PANEL, COMPREHENSIVE
A-G Ratio: 0.9 — ABNORMAL LOW (ref 1.2–3.5)
ALT (SGPT): 23 U/L (ref 12–65)
AST (SGOT): 29 U/L (ref 15–37)
Albumin: 3 g/dL — ABNORMAL LOW (ref 3.5–5.0)
Alk. phosphatase: 111 U/L (ref 50–136)
Anion gap: 5 mmol/L — ABNORMAL LOW (ref 7–16)
BUN: 14 MG/DL (ref 6–23)
Bilirubin, total: 0.6 MG/DL (ref 0.2–1.1)
CO2: 29 mmol/L (ref 21–32)
Calcium: 8.6 MG/DL (ref 8.3–10.4)
Chloride: 107 mmol/L (ref 98–107)
Creatinine: 0.6 MG/DL (ref 0.6–1.0)
GFR est AA: >60 mL/min/{1.73_m2} (ref >60)
GFR est non-AA: >60 mL/min/{1.73_m2} (ref >60)
Globulin: 3.2 g/dL (ref 2.3–3.5)
Glucose: 106 mg/dL — ABNORMAL HIGH (ref 65–100)
Potassium: 3.8 mmol/L (ref 3.5–5.1)
Protein, total: 6.2 g/dL — ABNORMAL LOW (ref 6.3–8.2)
Sodium: 141 mmol/L (ref 136–145)

## 2019-04-13 MED ORDER — CARVEDILOL 12.5 MG TAB
12.5 mg | ORAL_TABLET | Freq: Two times a day (BID) | ORAL | 1 refills | Status: AC
Start: 2019-04-13 — End: ?

## 2019-04-13 MED FILL — CARVEDILOL 12.5 MG TAB: 12.5 mg | ORAL | Qty: 1

## 2019-04-13 MED FILL — BUSPIRONE 5 MG TAB: 5 mg | ORAL | Qty: 2

## 2019-04-13 MED FILL — DULOXETINE 30 MG CAP, DELAYED RELEASE: 30 mg | ORAL | Qty: 1

## 2019-04-13 MED FILL — ALCOHOL 62% (NOZIN) NASAL SANITIZER: 62 % | CUTANEOUS | Qty: 1

## 2019-04-13 MED FILL — DOCUSATE SODIUM 100 MG CAP: 100 mg | ORAL | Qty: 1

## 2019-04-13 MED FILL — NUCYNTA 50 MG TABLET: 50 mg | ORAL | Qty: 1

## 2019-04-13 MED FILL — IBUPROFEN 200 MG TAB: 200 mg | ORAL | Qty: 2

## 2019-04-13 MED FILL — SORE THROAT (PHENOL) 1.4 % AEROSOL SPRAY: 1.4 % | Qty: 177

## 2019-04-13 NOTE — Progress Notes (Signed)
Pt's D/C instructions completed.  Verbalized understanding of all instructions including diet, activity, s/sx to alert MD, medications, wound care, and f/u appointment.  Family at BS.  D/C'd home with daughter wearing neck brace.

## 2019-04-13 NOTE — Progress Notes (Signed)
Hourly rounds complete this shift, no new complaints at this time, : bed in low, locked position, call light and bedside table within reach,  all needs met. Will continue to monitor Report to day shift nurse.

## 2019-04-13 NOTE — Progress Notes (Signed)
Problem: Falls - Risk of  Goal: *Absence of Falls  Description: Document Kristin [name] Fall Risk and appropriate interventions in the flowsheet.  Outcome: Resolved/Met     Problem: Patient Education: Go to Patient Education Activity  Goal: Patient/Family Education  Outcome: Resolved/Met

## 2019-04-13 NOTE — Progress Notes (Signed)
Hospitalist Consult Note     Admit Date:  04/11/2019 12:11 PM   Name:  Oval Moralez   Age:  59 y.o.  DOB:  29-May-1960   MRN:  [id number]   PCP:  Lacey Jensen, MD  Treatment Team: Attending Provider: Maryruth Bun, MD; Utilization Review: Loyce Dys, RN; Consulting Provider: Elmer Ramp, MD; Care Manager: Andreas Ohm; Hospitalist: Elmer Ramp, MD; Primary Nurse: Ardelia Mems, RN    Hospitalists consulted by ortho for: HTN    HPI:       3/3 - feeling better today.  BP better controlled.  No CP, SOB.  Anxiety improved.  Pain controlled    Assessment and Plan:     Hospital Problems as of 04/13/2019 Date Reviewed: May 12, 2019          Codes Class Noted - Resolved POA    Anxiety (Chronic) ICD-10-CM: F41.9  ICD-9-CM: 300.00  05/12/19 - Present Yes        Other secondary hypertension ICD-10-CM: I15.8  ICD-9-CM: 405.99  2019/05/12 - Present Yes        * (Principal) Cervical herniated disc ICD-10-CM: M50.20  ICD-9-CM: 722.0  04/11/2019 - Present Yes        Acquired spondylolisthesis ICD-10-CM: M43.10  ICD-9-CM: 738.4  09/02/2011 - Present Yes              Plan:  HTN  -would continue coreg at discharge for now.  I have ordered 1 week follow up with PCP to recheck and adjust as needed    Anxiety  -better today.  Cont home buspar    Am told pt going home today.  Discussed with ortho.  Have sent coreg rx to pharmacy.  We will sign off.  Thank you for this opportunity to participate in her care.  We will be happy to facilitate/do the discharge arrangements if needed    Past Medical History:   Diagnosis Date   ??? Asthma     as a child   ??? Chronic kidney disease     kidney stones   ??? Chronic pain     back   ??? Depression    ??? Hypertension     no meds, borderline   ??? Osteoarthritis       Past Surgical History:   Procedure Laterality Date   ??? HX CATARACT REMOVAL Bilateral 2020   ??? HX CESAREAN SECTION  1992   ??? HX GYN      tubal, cyst removed from left ovary, C section times 1   ??? HX HEENT      tonsills    ??? HX LUMBAR FUSION      L3 L4   ??? HX ORTHOPAEDIC      cyst removed both hands   ??? NEUROLOGICAL PROCEDURE UNLISTED      L4L5 spinal fussion   ??? PR ABDOMEN SURGERY PROC UNLISTED      lap chole      Allergies   Allergen Reactions   ??? Aspirin Nausea Only   ??? Celebrex [Celecoxib] Nausea Only      Social History     Tobacco Use   ??? Smoking status: Never Smoker   ??? Smokeless tobacco: Never Used   Substance Use Topics   ??? Alcohol use: Yes     Comment: rare      Family History   Problem Relation Age of Onset   ??? Heart Disease Mother    ??? Cancer Mother    ???  Diabetes Father    ??? Malignant Hyperthermia Neg Hx    ??? Pseudocholinesterase Deficiency Neg Hx    ??? Delayed Awakening Neg Hx    ??? Post-op Nausea/Vomiting Neg Hx    ??? Emergence Delirium Neg Hx    ??? Post-op Cognitive Dysfunction Neg Hx    ??? Other Neg Hx       Family history reviewed and noncontributory to patient's acute condition.    There is no immunization history on file for this patient.    Objective:     Patient Vitals for the past 24 hrs:   Temp Pulse Resp BP SpO2   04/13/19 0719 98.7 ??F (37.1 ??C) 86 17 138/84 93 %   04/13/19 0440 98.9 ??F (37.2 ??C) 88 16 139/87 96 %   04/12/19 2251 98.4 ??F (36.9 ??C) 87 16 (!) 135/90 94 %   04/12/19 1953 99.4 ??F (37.4 ??C) 91 16 (!) 145/83 97 %   04/12/19 1703 99.1 ??F (37.3 ??C) 90 12 (!) 143/82 96 %   04/12/19 1235 98.2 ??F (36.8 ??C) 90 18 (!) 145/89 97 %     Oxygen Therapy  O2 Sat (%): 93 % (04/13/19 0719)  Pulse via Oximetry: 73 beats per minute (04/11/19 1944)  O2 Device: Room air (04/13/19 0700)  O2 Flow Rate (L/min): 2 l/min (04/11/19 1934)    Estimated body mass index is 32.81 kg/m?? as calculated from the following:    Height as of this encounter: 5' (1.524 m).    Weight as of this encounter: 76.2 kg (168 lb).    Intake/Output Summary (Last 24 hours) at 04/13/2019 0902  Last data filed at 04/12/2019 1021  Gross per 24 hour   Intake ???   Output 700 ml   Net -700 ml        *Note that automatically entered I/Os may not be accurate; dependent on patient compliance with collection and accurate data entry by assistants.  Physical Exam:  General:    Well nourished.  No overt distress.    Eyes:   Normal sclerae.  Extraocular movements intact.  HENT:  Normocephalic, atraumatic.  Moist mucous membranes  CV:   RRR.   No edema  Lungs:  Even, Unlabored  Abdomen: nondistended.   Extremities: Warm and dry.  No cyanosis or clubbing  Neurologic: CN II-XII grossly intact.  Sensation intact.  Skin:     No rashes.  No jaundice.  Normal coloration  Psych:  Anxious, tearful    I reviewed the labs, imaging, EKGs, telemetry, and other studies done this admission.  Data Reviewed:   Recent Results (from the past 24 hour(s))   CBC W/O DIFF    Collection Time: 04/13/19  6:11 AM   Result Value Ref Range    WBC 12.6 (H) 4.3 - 11.1 K/uL    RBC 4.25 4.05 - 5.2 M/uL    HGB 12.6 11.7 - 15.4 g/dL    HCT 39.1 35.8 - 46.3 %    MCV 92.0 79.6 - 97.8 FL    MCH 29.6 26.1 - 32.9 PG    MCHC 32.2 31.4 - 35.0 g/dL    RDW 14.3 11.9 - 14.6 %    PLATELET 253 150 - 450 K/uL    MPV 11.1 9.4 - 12.3 FL    ABSOLUTE NRBC 0.00 0.0 - 0.2 K/uL   METABOLIC PANEL, COMPREHENSIVE    Collection Time: 04/13/19  6:11 AM   Result Value Ref Range    Sodium 141 136 -  145 mmol/L    Potassium 3.8 3.5 - 5.1 mmol/L    Chloride 107 98 - 107 mmol/L    CO2 29 21 - 32 mmol/L    Anion gap 5 (L) 7 - 16 mmol/L    Glucose 106 (H) 65 - 100 mg/dL    BUN 14 6 - 23 MG/DL    Creatinine 0.60 0.6 - 1.0 MG/DL    GFR est AA >60 >60 ml/min/1.60m    GFR est non-AA >60 >60 ml/min/1.738m   Calcium 8.6 8.3 - 10.4 MG/DL    Bilirubin, total 0.6 0.2 - 1.1 MG/DL    ALT (SGPT) 23 12 - 65 U/L    AST (SGOT) 29 15 - 37 U/L    Alk. phosphatase 111 50 - 136 U/L    Protein, total 6.2 (L) 6.3 - 8.2 g/dL    Albumin 3.0 (L) 3.5 - 5.0 g/dL    Globulin 3.2 2.3 - 3.5 g/dL    A-G Ratio 0.9 (L) 1.2 - 3.5         All Micro Results     None          SARS-CoV-2 Lab Results   "Novel Coronavirus" Test: No results found for: COV2NT   "Emergent Disease" Test: No results found for: EDPR  "SARS-COV-2" Test: No results found for: XGCOVT  Rapid Test: No results found for: COVR         Other Studies:  No results found for this visit on 04/11/19.    No results found.  Current Facility-Administered Medications   Medication Dose Route Frequency   ??? enalaprilat (VASOTEC) injection 1.25 mg  1.25 mg IntraVENous Q6H PRN   ??? labetaloL (NORMODYNE) tablet 100 mg  100 mg Oral QID PRN   ??? carvediloL (COREG) tablet 12.5 mg  12.5 mg Oral BID WITH MEALS   ??? LORazepam (ATIVAN) tablet 1 mg  1 mg Oral Q6H PRN   ??? DULoxetine (CYMBALTA) capsule 30 mg  30 mg Oral BID   ??? eszopiclone (LUNESTA) tablet 2 mg (Patient Supplied)  2 mg Oral QHS   ??? gabapentin enacarbil TbER 600 mg (Patient Supplied)  600 mg Oral BID   ??? tapentadoL (NUCYNTA) tablet 50 mg  50 mg Oral Q12H PRN   ??? busPIRone (BUSPAR) tablet 7.5 mg  7.5 mg Oral TID   ??? alcohol 62% (NOZIN) nasal sanitizer 1 Ampule  1 Ampule Topical Q12H   ??? sodium chloride (NS) flush 5-40 mL  5-40 mL IntraVENous Q8H   ??? sodium chloride (NS) flush 5-40 mL  5-40 mL IntraVENous PRN   ??? naloxone (NARCAN) injection 0.4 mg  0.4 mg IntraVENous PRN   ??? phenol throat spray (CHLORASEPTIC) 1 Spray  1 Spray Oral PRN   ??? diphenhydrAMINE (BENADRYL) capsule 25 mg  25 mg Oral Q4H PRN   ??? docusate sodium (COLACE) capsule 100 mg  100 mg Oral BID   ??? ibuprofen (MOTRIN) tablet 400 mg  400 mg Oral Q4H PRN   ??? HYDROmorphone (PF) (DILAUDID) injection 1 mg  1 mg IntraVENous Q4H PRN   ??? ondansetron (ZOFRAN ODT) tablet 4 mg  4 mg Oral Q4H PRN         Signed:  StElmer RampMD

## 2019-04-13 NOTE — Progress Notes (Signed)
ORTHO PROGRESS NOTE    April 13, 2019    Admit Date: 04/11/2019  Admit Diagnosis: Cervical spinal stenosis [M48.02];Cervical herniation [M50.20];Cervical herniated disc [M50.20]  Post Op day: 2 Days Post-Op      Subjective:     Kristin [name] is a patient who is now 2 Days Post-Op  and has no complaints.       Objective:        Vital Signs:    Patient Vitals for the past 8 hrs:   BP Temp Pulse Resp SpO2   04/13/19 0719 138/84 98.7 ??F (37.1 ??C) 86 17 93 %   04/13/19 0440 139/87 98.9 ??F (37.2 ??C) 88 16 96 %     Temp (24hrs), Avg:98.8 ??F (37.1 ??C), Min:98.2 ??F (36.8 ??C), Max:99.4 ??F (37.4 ??C)      LAB:    Recent Labs     04/13/19  0611   HGB 12.6   WBC 12.6*   PLT 253       I/O:  No intake/output data recorded.  03/01 1901 - 03/03 0700  In: -   Out: 1000 [Urine:1000]    Physical Exam:    Awake and in no acute distress.  Mood and affect appropriate.  Respirations unlabored and no evidence cyanosis.  Calves nontender.  Abdomen soft and nontender.  Dressing clean/dry  No new neurologic deficit.    Assessment:      Patient Active Problem List   Diagnosis Code   ??? 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 Acquired spondylolisthesis M43.10   ??? Cervical herniated disc M50.20   ??? Anxiety F41.9   ??? Other secondary hypertension I15.8       2 Days Post-Op STATUS POST Procedure(s):  C4-C5 SPINE ANTERIOR CERVICAL FUSION WITH INSTRUMENTATION NEED EXTRA TECH      Plan:       Anticipate discharge to: HOME today      Signed By: Brysen Shankman D Van Pelt, MD

## 2019-04-13 NOTE — Progress Notes (Signed)
CM met with patient and patient's daughter at bedside, to discuss discharge needs.No needs voiced at this time. Patient to discharge home this day. Please consult or notify CM if any needs shall arise. CM remains available.    Care Management Interventions  PCP Verified by CM: Yes(Yes PCP is located in [place]. )  Mode of Transport at Discharge: Other (see comment)(Daughter- Anderson Malta [phone].)  Transition of Care Consult (CM Consult): Discharge Planning  Discharge Durable Medical Equipment: No  Physical Therapy Consult: No  Occupational Therapy Consult: No  Speech Therapy Consult: No  Current Support Network: Own Home  Confirm Follow Up Transport: Self  The Plan for Transition of Care is Related to the Following Treatment Goals : Return to Baseline.   The Patient and/or Patient Representative was Provided with a Choice of Provider and Agrees with the Discharge Plan?: Yes  Name of the Patient Representative Who was Provided with a Choice of Provider and Agrees with the Discharge Plan: Patient.   Veteran Resource Information Provided?: No  Discharge Location  Discharge Placement: Home(Hotel (Patient Visiting from [facility]))

## 2019-04-13 NOTE — Progress Notes (Signed)
Hospitalist Consult Note     Admit Date:  04/11/2019 12:11 PM   Name:  Kristin [name]   Age:  59 y.o.  DOB:  29-May-1960   MRN:  [id number]   PCP:  Lacey Jensen, MD  Treatment Team: Attending Provider: Maryruth Bun, MD; Utilization Review: Loyce Dys, RN; Consulting Provider: Elmer Ramp, MD; Care Manager: Andreas Ohm; Hospitalist: Elmer Ramp, MD; Primary Nurse: Ardelia Mems, RN    Hospitalists consulted by ortho for: HTN    HPI:       3/3 - feeling better today.  BP better controlled.  No CP, SOB.  Anxiety improved.  Pain controlled    Assessment and Plan:     Hospital Problems as of 04/13/2019 Date Reviewed: May 12, 2019          Codes Class Noted - Resolved POA    Anxiety (Chronic) ICD-10-CM: F41.9  ICD-9-CM: 300.00  05/12/19 - Present Yes        Other secondary hypertension ICD-10-CM: I15.8  ICD-9-CM: 405.99  2019/05/12 - Present Yes        * (Principal) Cervical herniated disc ICD-10-CM: M50.20  ICD-9-CM: 722.0  04/11/2019 - Present Yes        Acquired spondylolisthesis ICD-10-CM: M43.10  ICD-9-CM: 738.4  09/02/2011 - Present Yes              Plan:  HTN  -would continue coreg at discharge for now.  I have ordered 1 week follow up with PCP to recheck and adjust as needed    Anxiety  -better today.  Cont home buspar    Am told pt going home today.  Discussed with ortho.  Have sent coreg rx to pharmacy.  We will sign off.  Thank you for this opportunity to participate in her care.  We will be happy to facilitate/do the discharge arrangements if needed    Past Medical History:   Diagnosis Date   ??? Asthma     as a child   ??? Chronic kidney disease     kidney stones   ??? Chronic pain     back   ??? Depression    ??? Hypertension     no meds, borderline   ??? Osteoarthritis       Past Surgical History:   Procedure Laterality Date   ??? HX CATARACT REMOVAL Bilateral 2020   ??? HX CESAREAN SECTION  1992   ??? HX GYN      tubal, cyst removed from left ovary, C section times 1   ??? HX HEENT      tonsills    ??? HX LUMBAR FUSION      L3 L4   ??? HX ORTHOPAEDIC      cyst removed both hands   ??? NEUROLOGICAL PROCEDURE UNLISTED      L4L5 spinal fussion   ??? PR ABDOMEN SURGERY PROC UNLISTED      lap chole      Allergies   Allergen Reactions   ??? Aspirin Nausea Only   ??? Celebrex [Celecoxib] Nausea Only      Social History     Tobacco Use   ??? Smoking status: Never Smoker   ??? Smokeless tobacco: Never Used   Substance Use Topics   ??? Alcohol use: Yes     Comment: rare      Family History   Problem Relation Age of Onset   ??? Heart Disease Mother    ??? Cancer Mother    ???  Diabetes Father    ??? Malignant Hyperthermia Neg Hx    ??? Pseudocholinesterase Deficiency Neg Hx    ??? Delayed Awakening Neg Hx    ??? Post-op Nausea/Vomiting Neg Hx    ??? Emergence Delirium Neg Hx    ??? Post-op Cognitive Dysfunction Neg Hx    ??? Other Neg Hx       Family history reviewed and noncontributory to patient's acute condition.    There is no immunization history on file for this patient.    Objective:     Patient Vitals for the past 24 hrs:   Temp Pulse Resp BP SpO2   04/13/19 0719 98.7 ??F (37.1 ??C) 86 17 138/84 93 %   04/13/19 0440 98.9 ??F (37.2 ??C) 88 16 139/87 96 %   04/12/19 2251 98.4 ??F (36.9 ??C) 87 16 (!) 135/90 94 %   04/12/19 1953 99.4 ??F (37.4 ??C) 91 16 (!) 145/83 97 %   04/12/19 1703 99.1 ??F (37.3 ??C) 90 12 (!) 143/82 96 %   04/12/19 1235 98.2 ??F (36.8 ??C) 90 18 (!) 145/89 97 %     Oxygen Therapy  O2 Sat (%): 93 % (04/13/19 0719)  Pulse via Oximetry: 73 beats per minute (04/11/19 1944)  O2 Device: Room air (04/13/19 0700)  O2 Flow Rate (L/min): 2 l/min (04/11/19 1934)    Estimated body mass index is 32.81 kg/m?? as calculated from the following:    Height as of this encounter: 5' (1.524 m).    Weight as of this encounter: 76.2 kg (168 lb).    Intake/Output Summary (Last 24 hours) at 04/13/2019 0902  Last data filed at 04/12/2019 1021  Gross per 24 hour   Intake ???   Output 700 ml   Net -700 ml       *Note that automatically entered I/Os may not be accurate;  dependent on patient compliance with collection and accurate data entry by assistants.  Physical Exam:  General:    Well nourished.  No overt distress.    Eyes:   Normal sclerae.  Extraocular movements intact.  HENT:  Normocephalic, atraumatic.  Moist mucous membranes  CV:   RRR.   No edema  Lungs:  Even, Unlabored  Abdomen: nondistended.   Extremities: Warm and dry.  No cyanosis or clubbing  Neurologic: CN II-XII grossly intact.  Sensation intact.  Skin:     No rashes.  No jaundice.  Normal coloration  Psych:  Anxious, tearful    I reviewed the labs, imaging, EKGs, telemetry, and other studies done this admission.  Data Reviewed:   Recent Results (from the past 24 hour(s))   CBC W/O DIFF    Collection Time: 04/13/19  6:11 AM   Result Value Ref Range    WBC 12.6 (H) 4.3 - 11.1 K/uL    RBC 4.25 4.05 - 5.2 M/uL    HGB 12.6 11.7 - 15.4 g/dL    HCT 39.1 35.8 - 46.3 %    MCV 92.0 79.6 - 97.8 FL    MCH 29.6 26.1 - 32.9 PG    MCHC 32.2 31.4 - 35.0 g/dL    RDW 14.3 11.9 - 14.6 %    PLATELET 253 150 - 450 K/uL    MPV 11.1 9.4 - 12.3 FL    ABSOLUTE NRBC 0.00 0.0 - 0.2 K/uL   METABOLIC PANEL, COMPREHENSIVE    Collection Time: 04/13/19  6:11 AM   Result Value Ref Range    Sodium 141 136 -  145 mmol/L    Potassium 3.8 3.5 - 5.1 mmol/L    Chloride 107 98 - 107 mmol/L    CO2 29 21 - 32 mmol/L    Anion gap 5 (L) 7 - 16 mmol/L    Glucose 106 (H) 65 - 100 mg/dL    BUN 14 6 - 23 MG/DL    Creatinine 0.60 0.6 - 1.0 MG/DL    GFR est AA >60 >60 ml/min/1.55m    GFR est non-AA >60 >60 ml/min/1.754m   Calcium 8.6 8.3 - 10.4 MG/DL    Bilirubin, total 0.6 0.2 - 1.1 MG/DL    ALT (SGPT) 23 12 - 65 U/L    AST (SGOT) 29 15 - 37 U/L    Alk. phosphatase 111 50 - 136 U/L    Protein, total 6.2 (L) 6.3 - 8.2 g/dL    Albumin 3.0 (L) 3.5 - 5.0 g/dL    Globulin 3.2 2.3 - 3.5 g/dL    A-G Ratio 0.9 (L) 1.2 - 3.5         All Micro Results     None          SARS-CoV-2 Lab Results  "Novel Coronavirus" Test: No results found for: COV2NT   "Emergent Disease"  Test: No results found for: EDPR  "SARS-COV-2" Test: No results found for: XGCOVT  Rapid Test: No results found for: COVR         Other Studies:  No results found for this visit on 04/11/19.    No results found.  Current Facility-Administered Medications   Medication Dose Route Frequency   ??? enalaprilat (VASOTEC) injection 1.25 mg  1.25 mg IntraVENous Q6H PRN   ??? labetaloL (NORMODYNE) tablet 100 mg  100 mg Oral QID PRN   ??? carvediloL (COREG) tablet 12.5 mg  12.5 mg Oral BID WITH MEALS   ??? LORazepam (ATIVAN) tablet 1 mg  1 mg Oral Q6H PRN   ??? DULoxetine (CYMBALTA) capsule 30 mg  30 mg Oral BID   ??? eszopiclone (LUNESTA) tablet 2 mg (Patient Supplied)  2 mg Oral QHS   ??? gabapentin enacarbil TbER 600 mg (Patient Supplied)  600 mg Oral BID   ??? tapentadoL (NUCYNTA) tablet 50 mg  50 mg Oral Q12H PRN   ??? busPIRone (BUSPAR) tablet 7.5 mg  7.5 mg Oral TID   ??? alcohol 62% (NOZIN) nasal sanitizer 1 Ampule  1 Ampule Topical Q12H   ??? sodium chloride (NS) flush 5-40 mL  5-40 mL IntraVENous Q8H   ??? sodium chloride (NS) flush 5-40 mL  5-40 mL IntraVENous PRN   ??? naloxone (NARCAN) injection 0.4 mg  0.4 mg IntraVENous PRN   ??? phenol throat spray (CHLORASEPTIC) 1 Spray  1 Spray Oral PRN   ??? diphenhydrAMINE (BENADRYL) capsule 25 mg  25 mg Oral Q4H PRN   ??? docusate sodium (COLACE) capsule 100 mg  100 mg Oral BID   ??? ibuprofen (MOTRIN) tablet 400 mg  400 mg Oral Q4H PRN   ??? HYDROmorphone (PF) (DILAUDID) injection 1 mg  1 mg IntraVENous Q4H PRN   ??? ondansetron (ZOFRAN ODT) tablet 4 mg  4 mg Oral Q4H PRN         Signed:  StElmer RampMD

## 2019-04-13 NOTE — Progress Notes (Signed)
Pt's D/C instructions completed.  Verbalized understanding of all instructions including diet, activity, s/sx to alert MD, medications, wound care, and f/u appointment.  Family at [facility].  D/C'd home with daughter wearing neck brace.

## 2019-04-13 NOTE — Progress Notes (Signed)
CM met with patient and patient's daughter at bedside, to discuss discharge needs.No needs voiced at this time. Patient to discharge home this day. Please consult or notify CM if any needs shall arise. CM remains available.    Care Management Interventions  PCP Verified by CM: Yes(Yes PCP is located in [place]. )  Mode of Transport at Discharge: Other (see comment)(Daughter- Victorino Dike [phone].)  Transition of Care Consult (CM Consult): Discharge Planning  Discharge Durable Medical Equipment: No  Physical Therapy Consult: No  Occupational Therapy Consult: No  Speech Therapy Consult: No  Current Support Network: Own Home  Confirm Follow Up Transport: Self  The Plan for Transition of Care is Related to the Following Treatment Goals : Return to Baseline.   The Patient and/or Patient Representative was Provided with a Choice of Provider and Agrees with the Discharge Plan?: Yes  Name of the Patient Representative Who was Provided with a Choice of Provider and Agrees with the Discharge Plan: Patient.   Veteran Resource Information Provided?: No  Discharge Location  Discharge Placement: Home(Hotel (Patient Visiting from [facility]))

## 2019-04-13 NOTE — Progress Notes (Signed)
ORTHO PROGRESS NOTE    April 13, 2019    Admit Date: 04/11/2019  Admit Diagnosis: Cervical spinal stenosis [M48.02];Cervical herniation [M50.20];Cervical herniated disc [M50.20]  Post Op day: 2 Days Post-Op      Subjective:     Kristin [name] is a patient who is now 2 Days Post-Op  and has no complaints.       Objective:        Vital Signs:    Patient Vitals for the past 8 hrs:   BP Temp Pulse Resp SpO2   04/13/19 0719 138/84 98.7 ??F (37.1 ??C) 86 17 93 %   04/13/19 0440 139/87 98.9 ??F (37.2 ??C) 88 16 96 %     Temp (24hrs), Avg:98.8 ??F (37.1 ??C), Min:98.2 ??F (36.8 ??C), Max:99.4 ??F (37.4 ??C)      LAB:    Recent Labs     04/13/19  0611   HGB 12.6   WBC 12.6*   PLT 253       I/O:  No intake/output data recorded.  03/01 1901 - 03/03 0700  In: -   Out: 1000 [Urine:1000]    Physical Exam:    Awake and in no acute distress.  Mood and affect appropriate.  Respirations unlabored and no evidence cyanosis.  Calves nontender.  Abdomen soft and nontender.  Dressing clean/dry  No new neurologic deficit.    Assessment:      Patient Active Problem List   Diagnosis Code   ??? 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 Acquired spondylolisthesis M43.10   ??? Cervical herniated disc M50.20   ??? Anxiety F41.9   ??? Other secondary hypertension I15.8       2 Days Post-Op STATUS POST Procedure(s):  C4-C5 SPINE ANTERIOR CERVICAL FUSION WITH INSTRUMENTATION NEED EXTRA TECH      Plan:       Anticipate discharge to: HOME today      Signed By: Reece Packer, MD

## 2019-04-14 NOTE — Progress Notes (Signed)
CTN attempted to outreach to patient per hospital discharge on 04/13/2019 for TOC call. Unable to reach patient. Messages left with contact information requesting a return call. Will continue to outreach per protocol.

## 2019-04-18 NOTE — Progress Notes (Signed)
3rd attempt to outreach to patient for TOC call.  Another message left with contact information requesting a return call. Patient is disengaged. Episode will be resolved at this time due to no return call.

## 2019-04-22 ENCOUNTER — Ambulatory Visit: Admit: 2019-04-22 | Discharge: 2019-04-22 | Payer: MEDICARE | Attending: Orthopaedic Surgery | Primary: Family Medicine

## 2019-04-22 ENCOUNTER — Encounter: Admit: 2019-04-22 | Discharge: 2019-04-22 | Payer: MEDICARE | Primary: Family Medicine

## 2019-04-22 ENCOUNTER — Encounter: Attending: Orthopaedic Surgery | Primary: Family Medicine

## 2019-04-22 ENCOUNTER — Ambulatory Visit: Attending: Orthopaedic Surgery

## 2019-04-22 DIAGNOSIS — Z981 Arthrodesis status: Secondary | ICD-10-CM | POA: Diagnosis not present

## 2019-04-22 NOTE — Progress Notes (Signed)
Name: Kristin [name]  Date of Birth: [birth year]  Gender: female  MRN: [id number]    DATE: 04/22/2019    Chief Complaint   Patient presents with   ??? Surgical Follow-up       HPI Thessaly is now 2 weeks out from her C4-5 ACDF fusion and interbody fixation she says that this eliminated all of her arm and shoulder pain and neck pain she still has some numbness tingling in the fingers I told that should probably get better over time she still wearing her cervical collar    PE: Her incision looks to be healing well is we did bunch up the soft tissues with the sutures I told her that should flatten out over time she is got normal range of motion of the upper extremities    CURRENT MEDS:     Current Outpatient Medications:   ???  busPIRone (BUSPAR) 7.5 mg tablet, TAKE 1 TABLET BY MOUTH THREE TIMES DAILY, Disp: , Rfl:   ???  DULoxetine (CYMBALTA) 30 mg capsule, TAKE 1 CAPSULE BY MOUTH TWICE DAILY, Disp: , Rfl:   ???  eszopiclone (LUNESTA) 2 mg tablet, TAKE 1 TABLET BY MOUTH ONCE DAILY BEFORE BEDTIME., Disp: , Rfl:   ???  Nucynta ER 100 mg tablet, TAKE 1 TABLET BY MOUTH EVERY 12 HOURS, Disp: , Rfl:   ???  carvediloL (COREG) 12.5 mg tablet, Take 1 Tab by mouth two (2) times daily (with meals). Indications: high blood pressure, Disp: 60 Tab, Rfl: 1  ???  diclofenac (Voltaren) 1 % gel, Apply  to affected area four (4) times daily., Disp: , Rfl:   ???  gabapentin enacarbil (Horizant) 600 mg TbER, Take 600 mg by mouth two (2) times daily as needed., Disp: , Rfl:   ???  tapentadoL (Nucynta) 50 mg tablet, Take 50 mg by mouth every twelve (12) hours as needed for Pain., Disp: , Rfl:      RADIOGRAPHS: AP lateral cervical spine x-rays    RADIOGRAPHIC IMAGING: X-rays show no titanium interbody device C4-5 that are secured to the bone with screws and her overall cervical lordosis looks good on the lateral view it looks like potentially there is a transverse fracture through the vertebral body in the very lower aspect just above the endplate or this  could just be artifact I cannot think of any reason why she would w keep everything stable.  Feel this is likely an artifact and the patient has not complained of neck pain.  The rest of the spine is unremarkable.  Ould have sustained a transverse fracture of C5    ASSESSMENT/PLAN: She is 2 weeks status post C4-5 cervical discectomy fusion doing well I told her to continue wear the cervical collar for 4 more weeks she did not have to sleep in it and in case that does represent a true fracture this should keep everything stable.  I will follow her up in 4 weeks time if she has any wound problems or change in conditions worsening pain issues she will give [place] a call    Diagnoses and all orders for this visit:    1. Arthrodesis status  -     XR SPINE CERV PA LAT ODONT 3 V MAX; Future    2. Postsurgical arthrodesis status         Follow-up and Dispositions    ?? Return in about 4 weeks (around 05/20/2019).          Electronically signed by Wandra Mannan,  MD

## 2019-04-22 NOTE — Progress Notes (Signed)
Progress  Notes by Maryruth Bun, MD at 04/22/19 1015                Author: Maryruth Bun, MD  Service: --  Author Type: Physician       Filed: 04/22/19 1235  Encounter Date: 04/22/2019  Status: Signed          Editor: Maryruth Bun, MD (Physician)                       Name: Kristin [name]   Date of Birth: 1960-12-17   Gender: female   MRN: [id number]      DATE: 04/22/2019        Chief Complaint       Patient presents with        ?  Surgical Follow-up           HPI Kristin [name] is now 2 weeks out from her C4-5 ACDF fusion and interbody fixation she says that this eliminated all of her arm and shoulder pain and neck pain she still has some numbness tingling in the fingers I told that should probably get better over  time she still wearing her cervical collar      PE: Her incision looks to be healing well is we did bunch up the soft tissues with the sutures I told her that should flatten out over time  she is got normal range of motion of the upper extremities      CURRENT MEDS:       Current Outpatient Medications:    ?  busPIRone (BUSPAR) 7.5 mg tablet, TAKE 1 TABLET BY MOUTH THREE TIMES DAILY, Disp: , Rfl:    ?  DULoxetine (CYMBALTA) 30 mg capsule, TAKE 1 CAPSULE BY MOUTH TWICE DAILY, Disp: , Rfl:    ?  eszopiclone (LUNESTA) 2 mg tablet, TAKE 1 TABLET BY MOUTH ONCE DAILY BEFORE BEDTIME., Disp: , Rfl:    ?  Nucynta ER 100 mg tablet, TAKE 1 TABLET BY MOUTH EVERY 12 HOURS, Disp: , Rfl:    ?  carvediloL (COREG) 12.5 mg tablet, Take 1 Tab by mouth two (2) times daily (with meals). Indications: high blood pressure, Disp: 60 Tab, Rfl: 1   ?  diclofenac (Voltaren) 1 % gel, Apply  to affected area four (4) times daily., Disp: , Rfl:    ?  gabapentin enacarbil (Horizant) 600 mg TbER, Take 600 mg by mouth two (2) times daily as needed., Disp: , Rfl:    ?  tapentadoL (Nucynta) 50 mg tablet, Take 50 mg by mouth every twelve (12) hours as needed for Pain., Disp: , Rfl:        RADIOGRAPHS: AP lateral cervical spine  x-rays      RADIOGRAPHIC IMAGING: X-rays show no titanium interbody device C4-5 that are secured to the bone with screws and her overall cervical lordosis looks good on the lateral view it looks like potentially  there is a transverse fracture through the vertebral body in the very lower aspect just above the endplate or this could just be artifact I cannot think of any reason why she would w keep everything stable.  Feel this is likely an artifact and the patient  has not complained of neck pain.  The rest of the spine is unremarkable.  Ould have sustained a transverse fracture of C5      ASSESSMENT/PLAN: She is 2 weeks status post C4-5 cervical discectomy fusion doing well I told  her to continue wear the cervical collar for 4 more weeks she did not have to sleep in it and in case that  does represent a true fracture this should keep everything stable.  I will follow her up in 4 weeks time if she has any wound problems or change in conditions worsening pain issues she will give [place] a call      Diagnoses and all orders for this visit:      1. Arthrodesis status   -     XR SPINE CERV PA LAT ODONT 3 V MAX; Future      2. Postsurgical arthrodesis status               Follow-up and Dispositions      ??  Return in about 4 weeks (around 05/20/2019).                 Electronically signed by Wandra Mannan, MD

## 2019-05-24 ENCOUNTER — Ambulatory Visit: Admit: 2019-05-24 | Discharge: 2019-05-24 | Payer: MEDICARE | Attending: Orthopaedic Surgery | Primary: Family Medicine

## 2019-05-24 ENCOUNTER — Encounter: Admit: 2019-05-24 | Discharge: 2019-05-24 | Payer: MEDICARE | Primary: Family Medicine

## 2019-05-24 ENCOUNTER — Ambulatory Visit: Attending: Orthopaedic Surgery

## 2019-05-24 DIAGNOSIS — Z981 Arthrodesis status: Secondary | ICD-10-CM | POA: Diagnosis not present

## 2019-05-24 NOTE — Progress Notes (Signed)
Name: Kristin [name]  Date of Birth: [birth year]  Gender: female  MRN: [id number]    DATE: 05/24/2019    CC: Surgical Follow-up (4 Week)       HPI 6-week postop check on Kristin [name].  We did a C4-5 ACDF and anterior fixation she is doing well she is really got no complaints reminds me that she is having some issues with her lower back walks no we will address it and I probably told her her next visit we can talk about it.    Pain Scale: /10        PE: She is normal appearance no gross deformities she got a c-collar in place I told her we do not need the more she took it off incisions well-healed.  She is got full range of motion and good strength in upper extremities.    CURRENT MEDS:     Current Outpatient Medications:   ???  busPIRone (BUSPAR) 7.5 mg tablet, TAKE 1 TABLET BY MOUTH THREE TIMES DAILY, Disp: , Rfl:   ???  DULoxetine (CYMBALTA) 30 mg capsule, TAKE 1 CAPSULE BY MOUTH TWICE DAILY, Disp: , Rfl:   ???  eszopiclone (LUNESTA) 2 mg tablet, TAKE 1 TABLET BY MOUTH ONCE DAILY BEFORE BEDTIME., Disp: , Rfl:   ???  Nucynta ER 100 mg tablet, TAKE 1 TABLET BY MOUTH EVERY 12 HOURS, Disp: , Rfl:   ???  carvediloL (COREG) 12.5 mg tablet, Take 1 Tab by mouth two (2) times daily (with meals). Indications: high blood pressure, Disp: 60 Tab, Rfl: 1  ???  gabapentin enacarbil (Horizant) 600 mg TbER, Take 600 mg by mouth two (2) times daily as needed., Disp: , Rfl:   ???  tapentadoL (Nucynta) 50 mg tablet, Take 50 mg by mouth every twelve (12) hours as needed for Pain., Disp: , Rfl:   ???  diclofenac (Voltaren) 1 % gel, Apply  to affected area four (4) times daily., Disp: , Rfl:      AP lateral cervical spine x-ray from today 05/24/19 the x-rays show her tract titanium interbody device to be well-positioned some bony contact cortical screws are the screws through the cage look well approximated in the bone no loosening or distraction of the space good lordosis lateral view she straight on the AP view she does have degenerative changes  at the C5-6 disc below.      ASSESSMENT/PLAN: Patient is doing well I will see her back in 6 more weeks hopefully the final check for her neck will get x-rays and will talk about her lower back pain    Diagnoses and all orders for this visit:    1. Arthrodesis status  -     XR SPINE CERV PA LAT ODONT 3 V MAX; Future    2. Postsurgical arthrodesis status         Orders Placed This Encounter   ??? XR SPINE CERV PA LAT ODONT 3 V MAX        Follow-up and Dispositions    ?? Return in about 6 weeks (around 07/05/2019).          Electronically signed by Wandra Mannan, MD

## 2019-05-24 NOTE — Progress Notes (Signed)
Progress  Notes by Maryruth Bun, MD at 05/24/19 1300                Author: Maryruth Bun, MD  Service: --  Author Type: Physician       Filed: 05/24/19 1335  Encounter Date: 05/24/2019  Status: Signed          Editor: Maryruth Bun, MD (Physician)                       Name: Rocky Link   Date of Birth: 1960/03/31   Gender: female   MRN: [id number]      DATE: 05/24/2019      CC: Surgical Follow-up (4 Week)          HPI 6-week postop check on Ms. Boese.  We did a C4-5 ACDF and anterior fixation she is doing well she is really got no complaints reminds me that she is having some issues with her lower back walks no we will address it and I probably told her her next  visit we can talk about it.      Pain Scale: /10           PE: She is normal appearance no gross deformities she got a c-collar in place I told her we do not need the more she took it off incisions  well-healed.  She is got full range of motion and good strength in upper extremities.      CURRENT MEDS:       Current Outpatient Medications:    ?  busPIRone (BUSPAR) 7.5 mg tablet, TAKE 1 TABLET BY MOUTH THREE TIMES DAILY, Disp: , Rfl:    ?  DULoxetine (CYMBALTA) 30 mg capsule, TAKE 1 CAPSULE BY MOUTH TWICE DAILY, Disp: , Rfl:    ?  eszopiclone (LUNESTA) 2 mg tablet, TAKE 1 TABLET BY MOUTH ONCE DAILY BEFORE BEDTIME., Disp: , Rfl:    ?  Nucynta ER 100 mg tablet, TAKE 1 TABLET BY MOUTH EVERY 12 HOURS, Disp: , Rfl:    ?  carvediloL (COREG) 12.5 mg tablet, Take 1 Tab by mouth two (2) times daily (with meals). Indications: high blood pressure, Disp: 60 Tab, Rfl: 1   ?  gabapentin enacarbil (Horizant) 600 mg TbER, Take 600 mg by mouth two (2) times daily as needed., Disp: , Rfl:    ?  tapentadoL (Nucynta) 50 mg tablet, Take 50 mg by mouth every twelve (12) hours as needed for Pain., Disp: , Rfl:    ?  diclofenac (Voltaren) 1 % gel, Apply  to affected area four (4) times daily., Disp: , Rfl:        AP lateral cervical spine x-ray from today  05/24/19 the x-rays show her tract titanium interbody device to be well-positioned some bony contact cortical screws are the screws through the cage look well approximated in the bone no loosening or distraction  of the space good lordosis lateral view she straight on the AP view she does have degenerative changes at the C5-6 disc below.        ASSESSMENT/PLAN: Patient is doing well I will see her back in 6 more weeks hopefully the final check for her neck will get x-rays and will talk about her lower back pain      Diagnoses and all orders for this visit:      1. Arthrodesis status   -     XR SPINE CERV PA LAT  ODONT 3 V MAX; Future      2. Postsurgical arthrodesis status               Orders Placed This Encounter        ?  XR SPINE CERV PA LAT ODONT 3 V MAX              Follow-up and Dispositions      ??  Return in about 6 weeks (around 07/05/2019).                 Electronically signed by Wandra Mannan, MD

## 2019-07-05 ENCOUNTER — Encounter: Admit: 2019-07-05 | Discharge: 2019-07-05 | Payer: MEDICARE | Primary: Family Medicine

## 2019-07-05 ENCOUNTER — Ambulatory Visit: Admit: 2019-07-05 | Discharge: 2019-07-05 | Payer: MEDICARE | Attending: Orthopaedic Surgery | Primary: Family Medicine

## 2019-07-05 ENCOUNTER — Ambulatory Visit: Attending: Orthopaedic Surgery

## 2019-07-05 DIAGNOSIS — M4322 Fusion of spine, cervical region: Secondary | ICD-10-CM | POA: Diagnosis not present

## 2019-07-05 DIAGNOSIS — Z981 Arthrodesis status: Secondary | ICD-10-CM

## 2019-07-05 NOTE — Progress Notes (Signed)
Name: Kristin [name]  Date of Birth: 1960/10/09  Gender: female  MRN: [id number]    DATE: 07/05/2019    CC: Follow-up       HPI this is about a 10-week postop follow-up on Kristin [name].  I did not see for for of a CD if with anterior fixation through the titanium interbody device.  We have alleviated her arm pain and we had alleviate her neck pain but she is reported some neck popping and stiffness here recently.  He denies any injury.    Pain Scale: /10        PE: On exam she is normal in appearance alert and oriented x3 has good range of motion of her neck and her upper extremities without evidence of pain.  Her incision is well-healed.    We obtained AP and lateral cervical x-rays today 07/05/2019 on these views her neck is straight she has an interbody device at C4-5 that is titanium and there are screws that go through the device into the bone and on the lateral view she is got excellent lordosis and she is got very good contact of the interbody device with the bone and the screws do not show any loosening.  She does have a lot of degenerative changes at the level below at C5-6.  The rest the spine is unremarkable.  Impression is stable C4-5 fusion with a titanium interbody device.  She has a lot of of the degenerative changes below at C5-6 but the rest of spine is normal.    CURRENT MEDS:     Current Outpatient Medications:   ???  busPIRone (BUSPAR) 7.5 mg tablet, TAKE 1 TABLET BY MOUTH THREE TIMES DAILY, Disp: , Rfl:   ???  DULoxetine (CYMBALTA) 30 mg capsule, TAKE 1 CAPSULE BY MOUTH TWICE DAILY, Disp: , Rfl:   ???  eszopiclone (LUNESTA) 2 mg tablet, TAKE 1 TABLET BY MOUTH ONCE DAILY BEFORE BEDTIME., Disp: , Rfl:   ???  Nucynta ER 100 mg tablet, TAKE 1 TABLET BY MOUTH EVERY 12 HOURS, Disp: , Rfl:   ???  carvediloL (COREG) 12.5 mg tablet, Take 1 Tab by mouth two (2) times daily (with meals). Indications: high blood pressure, Disp: 60 Tab, Rfl: 1  ???  gabapentin enacarbil (Horizant) 600 mg TbER, Take 600 mg by  mouth two (2) times daily as needed., Disp: , Rfl:   ???  tapentadoL (Nucynta) 50 mg tablet, Take 50 mg by mouth every twelve (12) hours as needed for Pain., Disp: , Rfl:   ???  diclofenac (Voltaren) 1 % gel, Apply  to affected area four (4) times daily., Disp: , Rfl:        ASSESSMENT/PLAN: Patient has not done any physical therapy so we will give her prescription for start of therapy for her neck I believe she lives around [place].  I will see her back in 6 more weeks we will repeat an x-ray and see how she is doing.    Diagnoses and all orders for this visit:    1. Arthrodesis status  -     XR SPINE CERV PA LAT ODONT 3 V MAX; Future  -     REFERRAL TO PHYSICAL THERAPY; Future         Orders Placed This Encounter   ??? XR SPINE CERV PA LAT ODONT 3 V MAX   ??? REFERRAL TO PHYSICAL THERAPY        Follow-up and Dispositions    ?? Return in about  6 weeks (around 08/16/2019).          Electronically signed by Wandra Mannan, MD

## 2019-07-05 NOTE — Progress Notes (Signed)
Progress  Notes by Wandra Mannan, MD at 07/05/19 1400                Author: Wandra Mannan, MD  Service: --  Author Type: Physician       Filed: 07/05/19 1524  Encounter Date: 07/05/2019  Status: Signed          Editor: Wandra Mannan, MD (Physician)                       Name: Kristin [name]   Date of Birth: 1961/01/10   Gender: female   MRN: [id number]      DATE: 07/05/2019      CC: Follow-up          HPI this is about a 10-week postop follow-up on Kristin [name].  I did not see for for of a CD if with anterior fixation through the titanium interbody device.  We have alleviated her arm pain and we had alleviate her neck pain but she is reported some  neck popping and stiffness here recently.  He denies any injury.      Pain Scale: /10           PE: On exam she is normal in appearance alert and oriented x3 has good range of motion of her neck and her upper extremities without evidence  of pain.  Her incision is well-healed.      We obtained AP and lateral cervical x-rays today 07/05/2019 on these views her neck is straight she has an interbody device at C4-5 that is titanium and there are screws that go through the device into the bone and on the lateral view she is got excellent  lordosis and she is got very good contact of the interbody device with the bone and the screws do not show any loosening.  She does have a lot of degenerative changes at the level below at C5-6.  The rest the spine is unremarkable.  Impression is stable  C4-5 fusion with a titanium interbody device.  She has a lot of of the degenerative changes below at C5-6 but the rest of spine is normal.      CURRENT MEDS:       Current Outpatient Medications:    ?  busPIRone (BUSPAR) 7.5 mg tablet, TAKE 1 TABLET BY MOUTH THREE TIMES DAILY, Disp: , Rfl:    ?  DULoxetine (CYMBALTA) 30 mg capsule, TAKE 1 CAPSULE BY MOUTH TWICE DAILY, Disp: , Rfl:    ?  eszopiclone (LUNESTA) 2 mg tablet, TAKE 1 TABLET BY MOUTH ONCE DAILY BEFORE BEDTIME., Disp:  , Rfl:    ?  Nucynta ER 100 mg tablet, TAKE 1 TABLET BY MOUTH EVERY 12 HOURS, Disp: , Rfl:    ?  carvediloL (COREG) 12.5 mg tablet, Take 1 Tab by mouth two (2) times daily (with meals). Indications: high blood pressure, Disp: 60 Tab, Rfl: 1   ?  gabapentin enacarbil (Horizant) 600 mg TbER, Take 600 mg by mouth two (2) times daily as needed., Disp: , Rfl:    ?  tapentadoL (Nucynta) 50 mg tablet, Take 50 mg by mouth every twelve (12) hours as needed for Pain., Disp: , Rfl:    ?  diclofenac (Voltaren) 1 % gel, Apply  to affected area four (4) times daily., Disp: , Rfl:           ASSESSMENT/PLAN: Patient has not done any physical therapy so we will give  her prescription for start of therapy for her neck I believe she lives around [place].  I will see her back in 6 more weeks  we will repeat an x-ray and see how she is doing.      Diagnoses and all orders for this visit:      1. Arthrodesis status   -     XR SPINE CERV PA LAT ODONT 3 V MAX; Future   -     REFERRAL TO PHYSICAL THERAPY; Future               Orders Placed This Encounter        ?  XR SPINE CERV PA LAT ODONT 3 V MAX        ?  REFERRAL TO PHYSICAL THERAPY              Follow-up and Dispositions      ??  Return in about 6 weeks (around 08/16/2019).                 Electronically signed by Wandra Mannan, MD

## 2019-08-16 ENCOUNTER — Encounter: Admit: 2019-08-16 | Discharge: 2019-08-16 | Payer: MEDICARE | Primary: Family Medicine

## 2019-08-16 ENCOUNTER — Ambulatory Visit: Admit: 2019-08-16 | Discharge: 2019-08-16 | Payer: MEDICARE | Attending: Orthopaedic Surgery | Primary: Family Medicine

## 2019-08-16 ENCOUNTER — Ambulatory Visit: Attending: Orthopaedic Surgery

## 2019-08-16 DIAGNOSIS — Z981 Arthrodesis status: Secondary | ICD-10-CM | POA: Diagnosis not present

## 2019-08-16 NOTE — Progress Notes (Signed)
Progress  Notes by Wandra Mannan, MD at 08/16/19 1400                Author: Wandra Mannan, MD  Service: --  Author Type: Physician       Filed: 08/16/19 1450  Encounter Date: 08/16/2019  Status: Signed          Editor: Wandra Mannan, MD (Physician)                       Name: Kristin [name]   Date of Birth: [birth year]   Gender: female   MRN: [id number]      DATE: 08/16/2019      CC: Surgical Follow-up (6 WEEKS)          HPI this is a 4-month postop check on Kristin [name].  We had done a C4-5 ACDF with a titanium interbody device and she does have some degeneration below at C5-6 and she is done reasonably well since her surgery.  She is also had lower back surgery in  the past and done reasonably well but always has some degree of of chronic pain.  She is not reporting any arm pain just a lot of soreness stiffness in the neck area.  She was not able to go to therapy because they had no openings available.  Here for  an x-ray check.  No significant change in her medical condition.             PE: Normal appearance and her incision looks well-healed.      AP lateral cervical spine x-rays August 16, 2019 show her titanium interbody device well-positioned in the midline on the AP view on lateral view as well position she still has the anterior spurs that were not totally removed but she is got good bony contact  the screws are in place Alexanian loosening or failure.  She still has some degeneration at C5-6 below the rest the neck is otherwise unremarkable and she is got good lordosis.  Impression is stable see 4 5 interbody fusion with titanium device and she  has some degeneration at C4 C5-6 below.  No changes from previous x-ray.      CURRENT MEDS:       Current Outpatient Medications:    ?  busPIRone (BUSPAR) 7.5 mg tablet, TAKE 1 TABLET BY MOUTH THREE TIMES DAILY, Disp: , Rfl:    ?  DULoxetine (CYMBALTA) 30 mg capsule, TAKE 1 CAPSULE BY MOUTH TWICE DAILY, Disp: , Rfl:    ?  eszopiclone (LUNESTA) 2 mg  tablet, TAKE 1 TABLET BY MOUTH ONCE DAILY BEFORE BEDTIME., Disp: , Rfl:    ?  Nucynta ER 100 mg tablet, TAKE 1 TABLET BY MOUTH EVERY 12 HOURS, Disp: , Rfl:    ?  carvediloL (COREG) 12.5 mg tablet, Take 1 Tab by mouth two (2) times daily (with meals). Indications: high blood pressure, Disp: 60 Tab, Rfl: 1   ?  gabapentin enacarbil (Horizant) 600 mg TbER, Take 600 mg by mouth two (2) times daily as needed., Disp: , Rfl:    ?  tapentadoL (Nucynta) 50 mg tablet, Take 50 mg by mouth every twelve (12) hours as needed for Pain., Disp: , Rfl:    ?  diclofenac (Voltaren) 1 % gel, Apply  to affected area four (4) times daily., Disp: , Rfl:           ASSESSMENT/PLAN: Plan I will see her back in 3  more months for final x-ray check just to make sure this does not degenerate and fell to fuse across the endplates.      Diagnoses and all orders for this visit:      1. Arthrodesis status   -     XR SPINE CERV PA LAT ODONT 3 V MAX; Future               Orders Placed This Encounter        ?  XR SPINE CERV PA LAT ODONT 3 V MAX              Follow-up and Dispositions      ??  Return in about 3 months (around 11/16/2019).                 Electronically signed by Wandra Mannan, MD

## 2019-11-16 ENCOUNTER — Encounter: Admit: 2019-11-16 | Discharge: 2019-11-16 | Payer: MEDICARE | Primary: Family Medicine

## 2019-11-16 ENCOUNTER — Ambulatory Visit: Admit: 2019-11-16 | Discharge: 2019-11-16 | Payer: MEDICARE | Attending: Orthopaedic Surgery | Primary: Family Medicine

## 2019-11-16 ENCOUNTER — Ambulatory Visit: Attending: Orthopaedic Surgery

## 2019-11-16 DIAGNOSIS — M4322 Fusion of spine, cervical region: Secondary | ICD-10-CM | POA: Diagnosis not present

## 2019-11-16 DIAGNOSIS — Z981 Arthrodesis status: Secondary | ICD-10-CM

## 2019-11-16 MED ORDER — PREDNISONE 10 MG TAB
10 mg | ORAL_TABLET | ORAL | 0 refills | Status: AC
Start: 2019-11-16 — End: 2019-11-28

## 2019-11-16 NOTE — Progress Notes (Signed)
Progress  Notes by Wandra Mannan, MD at 11/16/19 1430                Author: Wandra Mannan, MD  Service: --  Author Type: Physician       Filed: 11/16/19 1501  Encounter Date: 11/16/2019  Status: Signed          Editor: Wandra Mannan, MD (Physician)                       Name: Kristin [name]   Date of Birth: 06-07-1960   Gender: female   MRN: [id number]      DATE: 11/16/2019      CC: Follow-up (3 month)          HPI this is a recheck on [name].  She is about 7 months out from her C4-5 ACDF with placement of a titanium interbody device.  She had had good resolution of her neck and arm pain but here in the recent past she started getting some return  of neck discomfort and pain going in the left arm into the first and second finger and she has numbness and tingling in the first and second finger bilaterally.  She denies any new injury.  Patient has also had lower back surgery in the past and done  reasonably well from this.             PE: She is normal appearance alert and oriented x3.  She can raise her arms over her head easily.  She is got normal motor strength in all  muscle groups both upper extremities.  She has decreased sensation in the first and second fingers bilaterally.  Her neck range of motion shows some limitation.  In addition she is right-handed.      AP lateral cervical spine x-rays done today 11/16/2019 show the titanium interbody device at C4-5 well-positioned and with good bony contact.  The screws are in place no loosening.  She has developed bony fusion anterior to the interbody device that was  not present on previous x-rays.  What is also noted is she has significant degenerative changes below at C5-6.      CURRENT MEDS:       Current Outpatient Medications:    ?  busPIRone (BUSPAR) 7.5 mg tablet, TAKE 1 TABLET BY MOUTH THREE TIMES DAILY, Disp: , Rfl:    ?  DULoxetine (CYMBALTA) 30 mg capsule, TAKE 1 CAPSULE BY MOUTH TWICE DAILY, Disp: , Rfl:    ?  eszopiclone  (LUNESTA) 2 mg tablet, TAKE 1 TABLET BY MOUTH ONCE DAILY BEFORE BEDTIME., Disp: , Rfl:    ?  Nucynta ER 100 mg tablet, TAKE 1 TABLET BY MOUTH EVERY 12 HOURS, Disp: , Rfl:    ?  carvediloL (COREG) 12.5 mg tablet, Take 1 Tab by mouth two (2) times daily (with meals). Indications: high blood pressure, Disp: 60 Tab, Rfl: 1   ?  gabapentin enacarbil (Horizant) 600 mg TbER, Take 600 mg by mouth two (2) times daily as needed., Disp: , Rfl:    ?  tapentadoL (Nucynta) 50 mg tablet, Take 50 mg by mouth every twelve (12) hours as needed for Pain., Disp: , Rfl:    ?  diclofenac (Voltaren) 1 % gel, Apply  to affected area four (4) times daily., Disp: , Rfl:           ASSESSMENT/PLAN: Patient'Kristin [name] C4-5 fusion seems to be healing well  are healed and I think now she is developing symptoms from the C5-6 level which correspond to her pain in area of numbness.  Apparently  she is unable to get into any sustained physical therapy and [place] where she lives so we will hold off on that.  Instead we will try a Sterapred DS 12-day Dosepak followed by 2 weeks of ibuprofen 400 mg 3 times a day.  If she has return  of symptoms she will give [place] a call and we might need a new cervical MRI scan.      Diagnoses and all orders for this visit:      1. Arthrodesis status   -     XR SPINE CERV PA LAT ODONT 3 V MAX; Future               Orders Placed This Encounter        ?  XR SPINE CERV PA LAT ODONT 3 V MAX              Follow-up and Dispositions      ??  Return in about 6 months (around 05/16/2020).                 Electronically signed by Wandra Mannan, MD

## 2019-12-07 ENCOUNTER — Telehealth

## 2019-12-07 NOTE — Telephone Encounter (Signed)
Pam-  Pt  Called and wants to schedule   an mri  She wants to do it in [place]

## 2019-12-08 ENCOUNTER — Other Ambulatory Visit: Payer: Self-pay | Admitting: Orthopedic Surgery

## 2019-12-08 ENCOUNTER — Other Ambulatory Visit: Payer: Self-pay

## 2019-12-08 DIAGNOSIS — M542 Cervicalgia: Secondary | ICD-10-CM

## 2019-12-08 NOTE — Telephone Encounter (Signed)
Faxed MRI order to [facility]. Patient aware.

## 2019-12-15 NOTE — Telephone Encounter (Signed)
Pt asking to have her MRI in [place]. Says it is closer to where she lives. Asking for a call back

## 2019-12-19 NOTE — Telephone Encounter (Signed)
Returned call and gave patient number to [facility]. [phone]. I told her tha order was faxed over to them on 12/08/19. She will call and get the mri scheduled.

## 2019-12-25 ENCOUNTER — Other Ambulatory Visit: Payer: Self-pay

## 2020-01-17 ENCOUNTER — Other Ambulatory Visit: Payer: Self-pay | Admitting: Family Medicine

## 2020-01-17 ENCOUNTER — Telehealth

## 2020-01-17 DIAGNOSIS — Z1231 Encounter for screening mammogram for malignant neoplasm of breast: Secondary | ICD-10-CM

## 2020-01-17 NOTE — Telephone Encounter (Signed)
Wants to schedule a mri

## 2020-01-19 NOTE — Telephone Encounter (Signed)
SCHEDULED MRI AT [facility] FOR 01/23/20 AT 1:00

## 2020-01-23 ENCOUNTER — Encounter: Admit: 2020-01-23 | Discharge: 2020-01-23 | Payer: MEDICARE | Primary: Family Medicine

## 2020-01-23 DIAGNOSIS — M542 Cervicalgia: Secondary | ICD-10-CM | POA: Diagnosis not present

## 2020-01-23 DIAGNOSIS — M5030 Other cervical disc degeneration, unspecified cervical region: Secondary | ICD-10-CM | POA: Diagnosis not present

## 2020-01-23 DIAGNOSIS — Z981 Arthrodesis status: Secondary | ICD-10-CM

## 2020-02-21 NOTE — Telephone Encounter (Signed)
She had an MRI done and has not gotten results. Please call.

## 2020-02-21 NOTE — Telephone Encounter (Signed)
Tried to call patient and make her an appt but her mail box is full

## 2020-02-29 ENCOUNTER — Ambulatory Visit: Payer: Self-pay

## 2020-03-15 NOTE — Telephone Encounter (Signed)
Pam-   This patient lives in  [place]  And  Drives  From there  Can  Dr Samuel Bouche  Please  Call her  With  Her  December  Mri results?    There were previous messages in early  January

## 2021-01-28 DIAGNOSIS — Z809 Family history of malignant neoplasm, unspecified: Secondary | ICD-10-CM | POA: Diagnosis not present

## 2021-01-28 DIAGNOSIS — E669 Obesity, unspecified: Secondary | ICD-10-CM | POA: Diagnosis not present

## 2021-01-28 DIAGNOSIS — Z833 Family history of diabetes mellitus: Secondary | ICD-10-CM | POA: Diagnosis not present

## 2021-01-28 DIAGNOSIS — Z8249 Family history of ischemic heart disease and other diseases of the circulatory system: Secondary | ICD-10-CM | POA: Diagnosis not present

## 2021-01-28 DIAGNOSIS — R030 Elevated blood-pressure reading, without diagnosis of hypertension: Secondary | ICD-10-CM | POA: Diagnosis not present

## 2021-01-28 DIAGNOSIS — Z6836 Body mass index (BMI) 36.0-36.9, adult: Secondary | ICD-10-CM | POA: Diagnosis not present

## 2021-03-18 DIAGNOSIS — Z131 Encounter for screening for diabetes mellitus: Secondary | ICD-10-CM | POA: Diagnosis not present

## 2021-03-18 DIAGNOSIS — Z6837 Body mass index (BMI) 37.0-37.9, adult: Secondary | ICD-10-CM | POA: Diagnosis not present

## 2021-03-18 DIAGNOSIS — Z13220 Encounter for screening for lipoid disorders: Secondary | ICD-10-CM | POA: Diagnosis not present

## 2021-03-18 DIAGNOSIS — I10 Essential (primary) hypertension: Secondary | ICD-10-CM | POA: Diagnosis not present

## 2021-03-18 DIAGNOSIS — M2550 Pain in unspecified joint: Secondary | ICD-10-CM | POA: Diagnosis not present

## 2021-03-18 DIAGNOSIS — Z789 Other specified health status: Secondary | ICD-10-CM | POA: Diagnosis not present

## 2021-03-18 DIAGNOSIS — Z1231 Encounter for screening mammogram for malignant neoplasm of breast: Secondary | ICD-10-CM | POA: Diagnosis not present

## 2021-03-18 DIAGNOSIS — Z1331 Encounter for screening for depression: Secondary | ICD-10-CM | POA: Diagnosis not present

## 2021-03-18 DIAGNOSIS — K219 Gastro-esophageal reflux disease without esophagitis: Secondary | ICD-10-CM | POA: Diagnosis not present

## 2021-03-21 DIAGNOSIS — H3561 Retinal hemorrhage, right eye: Secondary | ICD-10-CM | POA: Diagnosis not present

## 2021-04-23 ENCOUNTER — Other Ambulatory Visit: Payer: Self-pay

## 2021-04-23 DIAGNOSIS — Z1231 Encounter for screening mammogram for malignant neoplasm of breast: Secondary | ICD-10-CM | POA: Diagnosis not present

## 2021-04-23 DIAGNOSIS — I2510 Atherosclerotic heart disease of native coronary artery without angina pectoris: Secondary | ICD-10-CM

## 2021-04-29 DIAGNOSIS — Z131 Encounter for screening for diabetes mellitus: Secondary | ICD-10-CM | POA: Diagnosis not present

## 2021-04-29 DIAGNOSIS — Z0000 Encounter for general adult medical examination without abnormal findings: Secondary | ICD-10-CM | POA: Diagnosis not present

## 2021-04-29 DIAGNOSIS — K219 Gastro-esophageal reflux disease without esophagitis: Secondary | ICD-10-CM | POA: Diagnosis not present

## 2021-04-29 DIAGNOSIS — Z79899 Other long term (current) drug therapy: Secondary | ICD-10-CM | POA: Diagnosis not present

## 2021-04-29 DIAGNOSIS — Z13220 Encounter for screening for lipoid disorders: Secondary | ICD-10-CM | POA: Diagnosis not present

## 2021-04-29 DIAGNOSIS — Z124 Encounter for screening for malignant neoplasm of cervix: Secondary | ICD-10-CM | POA: Diagnosis not present

## 2021-04-29 DIAGNOSIS — Z6836 Body mass index (BMI) 36.0-36.9, adult: Secondary | ICD-10-CM | POA: Diagnosis not present

## 2021-04-30 ENCOUNTER — Other Ambulatory Visit: Payer: Self-pay

## 2021-04-30 ENCOUNTER — Ambulatory Visit (INDEPENDENT_AMBULATORY_CARE_PROVIDER_SITE_OTHER): Payer: Medicare HMO

## 2021-04-30 DIAGNOSIS — I2510 Atherosclerotic heart disease of native coronary artery without angina pectoris: Secondary | ICD-10-CM

## 2021-05-01 DIAGNOSIS — Z124 Encounter for screening for malignant neoplasm of cervix: Secondary | ICD-10-CM | POA: Diagnosis not present

## 2021-05-07 ENCOUNTER — Telehealth: Payer: Self-pay | Admitting: Cardiology

## 2021-05-07 NOTE — Telephone Encounter (Signed)
Patient was returning call for results. Please advise °

## 2021-05-08 NOTE — Telephone Encounter (Signed)
Patient informed of results.  

## 2021-05-15 DIAGNOSIS — N6012 Diffuse cystic mastopathy of left breast: Secondary | ICD-10-CM | POA: Diagnosis not present

## 2021-05-15 DIAGNOSIS — N6489 Other specified disorders of breast: Secondary | ICD-10-CM | POA: Diagnosis not present

## 2021-05-15 DIAGNOSIS — N6011 Diffuse cystic mastopathy of right breast: Secondary | ICD-10-CM | POA: Diagnosis not present

## 2021-05-15 DIAGNOSIS — R928 Other abnormal and inconclusive findings on diagnostic imaging of breast: Secondary | ICD-10-CM | POA: Diagnosis not present

## 2021-12-16 DIAGNOSIS — R928 Other abnormal and inconclusive findings on diagnostic imaging of breast: Secondary | ICD-10-CM | POA: Diagnosis not present

## 2021-12-16 DIAGNOSIS — R92313 Mammographic fatty tissue density, bilateral breasts: Secondary | ICD-10-CM | POA: Diagnosis not present

## 2022-04-01 DIAGNOSIS — H26493 Other secondary cataract, bilateral: Secondary | ICD-10-CM | POA: Diagnosis not present

## 2022-04-01 DIAGNOSIS — H26492 Other secondary cataract, left eye: Secondary | ICD-10-CM | POA: Diagnosis not present

## 2022-04-08 DIAGNOSIS — H26492 Other secondary cataract, left eye: Secondary | ICD-10-CM | POA: Diagnosis not present

## 2022-05-27 DIAGNOSIS — H26491 Other secondary cataract, right eye: Secondary | ICD-10-CM | POA: Diagnosis not present

## 2022-06-04 DIAGNOSIS — H26492 Other secondary cataract, left eye: Secondary | ICD-10-CM | POA: Diagnosis not present

## 2022-06-04 DIAGNOSIS — H52223 Regular astigmatism, bilateral: Secondary | ICD-10-CM | POA: Diagnosis not present

## 2022-10-16 ENCOUNTER — Ambulatory Visit: Admit: 2022-10-16 | Payer: MEDICARE | Primary: Family Medicine

## 2022-10-16 ENCOUNTER — Ambulatory Visit: Admit: 2022-10-16 | Discharge: 2022-10-16 | Payer: MEDICARE | Attending: Physician Assistant | Primary: Family Medicine

## 2022-10-16 VITALS — Ht 60.0 in | Wt 215.0 lb

## 2022-10-16 DIAGNOSIS — M47812 Spondylosis without myelopathy or radiculopathy, cervical region: Secondary | ICD-10-CM | POA: Diagnosis not present

## 2022-10-16 DIAGNOSIS — Z981 Arthrodesis status: Secondary | ICD-10-CM | POA: Diagnosis not present

## 2022-10-16 DIAGNOSIS — M542 Cervicalgia: Secondary | ICD-10-CM | POA: Diagnosis not present

## 2022-10-16 NOTE — Progress Notes (Signed)
 Name: Kristin [name]  Date of Birth: [birth year]  Gender: female  MRN: [id number]    CC:   Chief Complaint   Patient presents with    New Patient     Neck pain         HPI:   Kristin [name] is a 61 y.o. female with a PMHx of previous ACDF with Dr. Duwaine in October 2021, chronic back pain and neck pain, previous Worker's Compensation injury, BMI of 42 today.         They present here for evaluation of neck pain rating to the bilateral arms and hands with numbness and tingling.  This been going on now for more than a year.  She previously had neck surgery with Dr. Duwaine for compression of her spinal cord in which some of her symptoms got better.  However in the last year or so she has had progressively worsening pain and numbness in her arms.  She has neck pain and stiffness.  Occasionally feels like she has a pain in her head or pain around her face.  She also has clumsiness of her hands, frequently dropping things.  She feels unsteady on her feet like her balance is not good.  She has been taking ibuprofen  and Tylenol  as needed.  Prior to this she was prescribed Cymbalta, Nucynta, gabapentin .  She lives approximately 3 hours away from here.  She reports some trouble with word finding at times.  She feels like when her neck pain is worse it bothers and makes her cognitive function worse.      Pain Scale: Up to 10/10  ADL's affected: Walking, sleeping  Conservative treatment attempted: Tylenol , ibuprofen , other medications listed above          Past Medical History Includes:   Past Medical History:   Diagnosis Date    Asthma     as a child    Chronic kidney disease     kidney stones    Chronic pain     back    Depression     Hypertension     no meds, borderline    Osteoarthritis    ,   Past Surgical History:   Procedure Laterality Date    CATARACT REMOVAL Bilateral 2020    CESAREAN SECTION  1992    GYN      tubal, cyst removed from left ovary, C section times 1    HEENT      tonsills    LUMBAR FUSION       L3 L4    NEUROLOGICAL SURGERY      L4L5 spinal fussion    ORTHOPEDIC SURGERY      cyst removed both hands    PR ABDOMEN SURGERY PROC UNLISTED      lap chole     Family History:   Family History   Problem Relation Age of Onset    Emergence Delirium Neg Hx     Post-op Cognitive Dysfunction Neg Hx     Other Neg Hx     Heart Disease Mother     Cancer Mother     Diabetes Father     Malig Hypertherm Neg Hx     Pseudochol. Deficiency Neg Hx     Delayed Awakening Neg Hx     Post-op Nausea/Vomiting Neg Hx       Social History:   Social History     Tobacco Use    Smoking status: Never    Smokeless tobacco:  Never   Substance Use Topics    Alcohol  use: Yes       ALLERGIES:   Allergies   Allergen Reactions    Aspirin Nausea Only    Celecoxib Nausea Only          Patient Medications    Current Outpatient Medications   Medication Sig Dispense Refill    busPIRone (BUSPAR) 7.5 MG tablet TAKE 1 TABLET BY MOUTH THREE TIMES DAILY      carvedilol (COREG) 12.5 MG tablet Take 12.5 mg by mouth 2 times daily (with meals)      diclofenac sodium (VOLTAREN) 1 % GEL Apply topically 4 times daily      DULoxetine (CYMBALTA) 30 MG extended release capsule TAKE 1 CAPSULE BY MOUTH TWICE DAILY      eszopiclone (LUNESTA) 2 MG TABS TAKE 1 TABLET BY MOUTH ONCE DAILY BEFORE BEDTIME.      Gabapentin  Enacarbil ER (HORIZANT) 600 MG TBCR Take 600 mg by mouth 2 times daily as needed.      tapentadol (NUCYNTA ER) 100 MG TB12 extended release tablet TAKE 1 TABLET BY MOUTH EVERY 12 HOURS      tapentadol (NUCYNTA) 50 MG TABS Take 50 mg by mouth.       No current facility-administered medications for this visit.             Review of Systems:  As per HPI.  Pertinent positives and negatives are addressed with the patient, particularly those related to musculoskeletal concerns.   Other non-emergent concerns were referred back to the primary care physician.      PHYSICAL EXAMINATION:   The patient appears their stated age and they are in no distress.  Ht 1.524 m (5')    Wt 97.5 kg (215 lb)   BMI 41.99 kg/m         Neuro:   Reflexes  Bicep:   Normal  Tricep: Normal  BR:       Normal    Hoffman's: Equivocal        Sensory    Sensation to light touch diminished to multiple dermatomes in the hands bilaterally      Motor    Deltoids: grossly normal    Biceps:  grossly normal    Triceps: grossly normal  Wrist extension/flexion: grossly normal    I/O:  grossly normal    Grip: grossly normal     Point tenderness: Negative    Gait: Mildly unsteady and ataxic across the room              IMAGING:     MRI Result (most recent):  MRI CERVICAL SPINE WO CONTRAST 01/23/2020    Narrative  History: Neck pain. History of prior surgery. No trauma.    Exam: MRI Cervical Spine without contrast    Technique: Sagittal T1, T2, STIR, axial T2 and gradient echo sequences are  available for review.    Comparison: No comparison    Findings:    There is susceptibility artifact from fusion hardware at C4-5. Degenerative disc  signal present C2-3, C3-4, C5-6, and C6-7. There is mild high T2 signal seen in  the cervical cord at C4-5. Limited evaluation of the posterior fossa, clivus,  and craniocervical junction is normal.      C2-3: There is mild facet arthropathy without spinal stenosis or neural  foraminal narrowing.    C3-4: There is a broad-based disc osteophyte complex at this level with  bilateral facet arthropathy. There is moderate spinal  stenosis and moderate left  neural foraminal narrowing.    C4-5: Postsurgical change present at this level. There is moderate spinal  stenosis. Susceptibility artifact limits the evaluation of the neural foramina.    C5-6: There is a broad-based disc osteophyte complex with bilateral facet  arthropathy. There is moderate spinal stenosis and severe right neural foraminal  narrowing. There is mild left neural foraminal compromise.    C6-7: There is a central disc protrusion at this level with moderate spinal  stenosis and mild right neural foraminal  narrowing.    C7-T1: No spinal canal or neural foraminal compromise.    Impression  Impression:  1. Postsurgical change at C4-5. There is mild high signal seen in the cervical  cord, which could represent edema or myelomalacia.  2. Advanced multilevel cervical spondylosis as detailed level by level above.        AP and lateral views of the cervical spine today reveal previous anterior instrumentation at C4-5.  Severe cervical spondylosis and bridging osteophytes at C4, C5 and C6.  Moderate DDD          ASSESSMENT AND PLAN:   The patient has symptoms of neck pain and cervical radiculopathy in a multi dermatomal pattern.  She also has symptoms suggestive of early cervical myelopathy.  I am concerned she may have adjacent disc disease and may have developed spinal cord compression contributing to some early myelopathic features.  I recommend a course of physical therapy and a cervical MRI for further evaluation to rule out spinal cord compression or significant foraminal stenosis that may merit surgical decompression.  Patient will reach out to our office after she completes the MRI closer to where she lives and we can follow-up with her to discuss the results by phone.    4--this is a chronic illness/condition with exacerbation        Clinical Notes:   I/my clinic will serve as the continuing focal point for all needed health care services and/or medical care services that are part of ongoing PAIN care related to patient's single, serious, or complex PAIN condition with the following diagnoses: Cervical spondylosis   Chronic Pain Condition that is not stable = not at the patient's treatment goal

## 2022-10-20 NOTE — Progress Notes (Signed)
 Called patient back fax# for [facility] is [phone], disregard previous msg that was not a working #.

## 2022-10-20 NOTE — Progress Notes (Signed)
 Patient requested mri order to [facility] fax [phone].

## 2022-10-23 NOTE — Telephone Encounter (Signed)
 A representative from the [facility] called today and states that the patient is scheduled at their facility for an MRI, but there isn't an authorization. They request an authorization for the patient and their NPI number is [id number] and the phone number to call them back at is [phone].  Thank you.

## 2022-10-24 DIAGNOSIS — M4802 Spinal stenosis, cervical region: Secondary | ICD-10-CM | POA: Diagnosis not present

## 2022-10-24 DIAGNOSIS — M47812 Spondylosis without myelopathy or radiculopathy, cervical region: Secondary | ICD-10-CM | POA: Diagnosis not present

## 2022-11-03 NOTE — Telephone Encounter (Signed)
 Patient called in and asked if she could see the surgeon instead of the PA that she saw last time so that she doesn't have to drive to [place] 2 different times.  She has had the MRI and was informed to bring the MRI disc along with the report to the appointment.  Patient is scheduled with Dr. Jetta on 11/18/22 after speaking with CMA with [facility].

## 2022-11-18 ENCOUNTER — Encounter: Payer: MEDICARE | Primary: Family Medicine

## 2022-11-19 ENCOUNTER — Ambulatory Visit: Admit: 2022-11-19 | Discharge: 2022-11-19 | Payer: MEDICARE | Primary: Diagnostic Radiology

## 2022-11-19 ENCOUNTER — Ambulatory Visit: Admit: 2022-11-19 | Payer: MEDICARE | Primary: Diagnostic Radiology

## 2022-11-19 DIAGNOSIS — G959 Disease of spinal cord, unspecified: Secondary | ICD-10-CM | POA: Diagnosis not present

## 2022-11-19 DIAGNOSIS — Z981 Arthrodesis status: Secondary | ICD-10-CM | POA: Diagnosis not present

## 2022-11-19 DIAGNOSIS — M5030 Other cervical disc degeneration, unspecified cervical region: Secondary | ICD-10-CM | POA: Diagnosis not present

## 2022-11-19 DIAGNOSIS — M2578 Osteophyte, vertebrae: Secondary | ICD-10-CM | POA: Diagnosis not present

## 2022-11-19 NOTE — Progress Notes (Signed)
Name: Kristin [name]  Date of Birth: [birth year]  Gender: female  MRN: [id number]  Age: 62 y.o.    Chief Complaint: Neck pain and bilateral shoulder pain, balance issues, dexterity issues  History of present illness:    This is a very pleasant 62 y.o. female who presents with acute on chronic history of neck pain, bilateral shoulder pain, bilateral hand numbness, balance issues, dexterity issues.  Is going on since 2001.  She had a C4-5 stand-alone ACDF done in October 2021.  She states her neck feels stiff and has limited range of motion.  States she has balance issues and feels like she is going to fall over.  She also has numbness and tingling in her thumb, index finger, long finger bilaterally.  Her pain is 8/10 in severity.  She has not done any treatment for this.  She is here for a surgical evaluation.      Medications:     Prior to Visit Medications    Medication Sig Taking? Authorizing Provider   busPIRone (BUSPAR) 7.5 MG tablet TAKE 1 TABLET BY MOUTH THREE TIMES DAILY  Automatic Reconciliation, Ar   carvedilol (COREG) 12.5 MG tablet Take 12.5 mg by mouth 2 times daily (with meals)  Automatic Reconciliation, Ar   diclofenac sodium (VOLTAREN) 1 % GEL Apply topically 4 times daily  Automatic Reconciliation, Ar   DULoxetine (CYMBALTA) 30 MG extended release capsule TAKE 1 CAPSULE BY MOUTH TWICE DAILY  Automatic Reconciliation, Ar   eszopiclone (LUNESTA) 2 MG TABS TAKE 1 TABLET BY MOUTH ONCE DAILY BEFORE BEDTIME.  Automatic Reconciliation, Ar   Gabapentin Enacarbil ER (HORIZANT) 600 MG TBCR Take 600 mg by mouth 2 times daily as needed.  Automatic Reconciliation, Ar   tapentadol (NUCYNTA ER) 100 MG TB12 extended release tablet TAKE 1 TABLET BY MOUTH EVERY 12 HOURS  Automatic Reconciliation, Ar   tapentadol (NUCYNTA) 50 MG TABS Take 50 mg by mouth.  Automatic Reconciliation, Ar       Allergies:     Allergies   Allergen Reactions    Aspirin Nausea Only    Celecoxib Nausea Only        Physical Exam:      This is a well developed well nourished adult female in no acute distress.     Oriented to person, place and time.    Mood and affect are appropriate.    Respirations are unlabored and there is no evidence of cyanosis.    Pulses in the upper extremities are palpable and equal      Sensory testing:       C5   C6   C7   C8    T1   Right 2 2 2 2 2    Left 2 2 2 2 2      0=absent; 1=altered; 2=normal; NT= not tested  Reflexes     Right Left   Biceps (C5) 2 2   Brachio radialis (C6) 2 2    Triceps (C7) 2 2       Strength testing in the upper extremity reveals the following based on the 5 point grading scale:     Biceps  (C5) WE  (C6) Tricep (C7) FDP   (C8) Intrinsics   (T1)   Right 5 5 5 5 5    Left 5 5 5 5 5    0=total paralysis; 1=palpable or visible contraction; 2= active movement with gravity eliminated; 3= active movement against gravity; 4= active movement against moderate resistance; 5= active movement  against full resistance     Hoffman's is positive     Inverted radial reflex is negative    Romberg's test is negative    Unable to heel toe walk    Positive carpal tunnel compression test bilaterally    Radiographic Studies:    Radiographs:    AP and lateral views of the cervical spine, reveal prior stand-alone cervical interbody placed at C4-5.  Large osteophyte present at that level anteriorly.  Large bridging osteophyte at C5-6.  Other large osteophytes at the levels.  Straightening of the cervical spine          MRI Cervical spine, report and images reviewed and interpreted and reveals moderate to severe spinal stenosis from C3-C7.  There is straightening of the cervical spine.  MRI of the cervical spine suboptimal.  Unable to clearly see if there is any type of myelomalacia present    Course and size of the vertebral arteries: Normal      Diagnosis:      ICD-10-CM    1. Cervical myelopathy (HCC)  G95.9 XR CERVICAL SPINE FLEXION AND EXTENSION      2. Degenerative disc disease, cervical  M50.30 XR CERVICAL SPINE  FLEXION AND EXTENSION      3. Neck pain  M54.2 XR CERVICAL SPINE FLEXION AND EXTENSION          Assessment/Plan:      -I discussed the patient the diagnosis as well is the imaging findings.  At this time I recommend an EMG of the bilateral upper extremities because I believe the patient has carpal tunnel syndrome.  She also has signs and symptoms of myelopathy.  We discussed the natural progression of cervical myelopathy and the patient will think about surgery if she would like to pursue it.  The surgery that he would be most beneficial for her would be a C3-T1 posterior cervical fusion and a C3-C7 laminectomy. We did discuss risk of surgery which includes failure to alleviate symptoms, adjacent segment degeneration, pseudoarthrosis, esophageal injury, spinal fluid leakage, vascular injury, neurologic risk of loss of function or paralysis, infection, dysphagia (chronic or temporary), dysphonia (chronic or temporary), medical risk factors including heart attack, stroke, blood clot.   She will call and let us know when her EMG is done and we will do a phone visit to go over her EMG results as well as see if she would like to proceed with cervical spine surgery for her cervical myelopathy.  Patient was in agreement with the plan          Quita Skye. Levonne Hubert, MD  Orthopaedic Spine Surgery     11/19/22  4:39 PM    All or part of this note was transcribed using dictation software.  Although the note was proofread, grammatical and spelling errors may occur.

## 2022-11-24 ENCOUNTER — Encounter

## 2022-11-24 NOTE — Telephone Encounter (Signed)
I called to let patient know EMG order has been faxed to [facility] and they will call her to schedule the appointment. Patient will call to let us know when she is scheduled and I will schedule a televisit with JDW.      Patient verbalized understanding.

## 2022-11-24 NOTE — Progress Notes (Signed)
Order for bilateral Upper extremity order faxed to [facility]; they will call to schedule patient for an appointment. Patient to call office to schedule appointment after test is completed.     Patient is aware.

## 2022-12-02 NOTE — Telephone Encounter (Signed)
She would like to speak to you regarding the carpal tunnel study. She hasn't heard from anyone. Please call.

## 2022-12-02 NOTE — Telephone Encounter (Signed)
Returned call spoke to patient she is aware order was faxed last week and I will fax ir=t again to number provided. Patient will call after she's scheduled to schedule a phone visit with Dr. Levonne Hubert.

## 2022-12-02 NOTE — Telephone Encounter (Signed)
She is calling to let you know she did call the location and they said they don't see a referral in the system.

## 2022-12-02 NOTE — Telephone Encounter (Signed)
She is calling to give you a fax number of [phone] to send it to. Attn: Corky Crafts

## 2022-12-03 NOTE — Telephone Encounter (Signed)
The place that you sent the referral for her to have the nerve testing accidentally deleted all of the referral and info. She is asking if you will resend please.

## 2022-12-03 NOTE — Telephone Encounter (Signed)
Order has been refaxed. Patient is aware.

## 2022-12-15 NOTE — Telephone Encounter (Signed)
Her appt is [id number]/ for her nerve cond test

## 2022-12-15 NOTE — Progress Notes (Signed)
Patient is scheduled for her EMG/NCS on 12/23/22.

## 2022-12-23 DIAGNOSIS — G5603 Carpal tunnel syndrome, bilateral upper limbs: Secondary | ICD-10-CM | POA: Diagnosis not present

## 2022-12-23 NOTE — Telephone Encounter (Signed)
 Pt called has a nerve cond test.she has carpal tunnel

## 2022-12-23 NOTE — Telephone Encounter (Signed)
 Returned patients call spoke to patient states she had the EMG/NCS done and would like to schedule a phone follow up.      Patient is scheduled Friday 12/26/22 at 11:15 am to discuss EMG results and surgery.

## 2022-12-25 NOTE — Telephone Encounter (Signed)
 Patient is confused about tomorrow's appointment asks for a return call please.

## 2022-12-25 NOTE — Telephone Encounter (Signed)
 Returned call no answer, no vm.

## 2022-12-26 ENCOUNTER — Encounter: Payer: MEDICARE | Primary: Family Medicine

## 2022-12-31 ENCOUNTER — Encounter: Payer: MEDICARE | Primary: Family Medicine

## 2023-01-06 ENCOUNTER — Ambulatory Visit: Admit: 2023-01-06 | Discharge: 2023-01-06 | Payer: MEDICARE | Primary: Family Medicine

## 2023-01-06 DIAGNOSIS — G959 Disease of spinal cord, unspecified: Secondary | ICD-10-CM | POA: Diagnosis not present

## 2023-01-06 NOTE — Progress Notes (Signed)
 Name: Kristin [name]  Date of Birth: [birth year]  Gender: female  MRN: [id number]  Age: 62 y.o.    Chief Complaint: EMG follow-up  History of present illness:    This is a very pleasant 62 y.o. female who presents with history of cervical stenosis

## 2023-01-07 NOTE — Telephone Encounter (Signed)
 Called spoke to patient she is aware Dr. Levonne Hubert doesn't prescribe pain medication prior to surgery. Patient was advised to alternate Tylenol and Ibuprofen. Patient verbalized understanding.

## 2023-01-07 NOTE — Telephone Encounter (Signed)
 Pt  called said she need pain rx

## 2023-01-12 ENCOUNTER — Encounter: Admit: 2023-01-12

## 2023-01-12 DIAGNOSIS — G959 Disease of spinal cord, unspecified: Secondary | ICD-10-CM

## 2023-01-13 NOTE — Telephone Encounter (Signed)
 Called spoke to patient she is aware preassessment appointment is scheduled on 01/22/23 at 1 pm. Patient is aware of date, time, and location. Patient verbalized understanding.

## 2023-01-22 ENCOUNTER — Inpatient Hospital Stay: Admit: 2023-01-22 | Payer: MEDICARE | Primary: Family Medicine

## 2023-01-22 VITALS — BP 174/90 | HR 88 | Temp 97.30000°F | Resp 18 | Ht 61.0 in | Wt 226.7 lb

## 2023-01-22 DIAGNOSIS — Z01812 Encounter for preprocedural laboratory examination: Secondary | ICD-10-CM | POA: Diagnosis not present

## 2023-01-22 LAB — URINALYSIS
Bilirubin, Urine: NEGATIVE
Blood, Urine: NEGATIVE
Glucose, Ur: NEGATIVE mg/dL
Ketones, Urine: NEGATIVE mg/dL
Leukocyte Esterase, Urine: NEGATIVE
Nitrite, Urine: NEGATIVE
Protein, UA: NEGATIVE mg/dL
Specific Gravity, UA: 1.021 (ref 1.001–1.023)
Urobilinogen, Urine: 0.2 U/dL (ref 0.2–1.0)
pH, Urine: 6 (ref 5.0–9.0)

## 2023-01-22 LAB — CBC
Hematocrit: 43.6 % (ref 35.8–46.3)
Hemoglobin: 14.2 g/dL (ref 11.7–15.4)
MCH: 29.2 pg (ref 26.1–32.9)
MCHC: 32.6 g/dL (ref 31.4–35.0)
MCV: 89.5 fL (ref 82.0–102.0)
MPV: 10.7 fL (ref 9.4–12.3)
Platelets: 304 10*3/uL (ref 150–450)
RBC: 4.87 M/uL (ref 4.05–5.2)
RDW: 14.6 % (ref 11.9–14.6)
WBC: 7.2 10*3/uL (ref 4.3–11.1)
nRBC: 0 10*3/uL (ref 0.0–0.2)

## 2023-01-22 LAB — BASIC METABOLIC PANEL
Anion Gap: 14 mmol/L (ref 7–16)
BUN: 9 mg/dL (ref 8–23)
CO2: 25 mmol/L (ref 20–29)
Calcium: 9.3 mg/dL (ref 8.8–10.2)
Chloride: 104 mmol/L (ref 98–107)
Creatinine: 0.9 mg/dL (ref 0.60–1.10)
Est, Glom Filt Rate: 73 mL/min/{1.73_m2} (ref >60)
Glucose: 124 mg/dL — ABNORMAL HIGH (ref 70–99)
Potassium: 3.3 mmol/L — ABNORMAL LOW (ref 3.5–5.1)
Sodium: 143 mmol/L (ref 136–145)

## 2023-01-22 LAB — PROTIME-INR
INR: 1
Protime: 13.3 s (ref 11.3–14.9)

## 2023-01-22 NOTE — Progress Notes (Signed)
PLEASE CONTINUE TAKING ALL PRESCRIPTION MEDICATIONS UP TO THE DAY OF SURGERY UNLESS OTHERWISE DIRECTED BELOW. You may take Tylenol, allergy,  and/or indigestion medications.     TAKE ONLY THESE MEDICATIONS ON THE DAY OF SURGERY               DISCONTINUE all vitamins and supplements 7 days prior to surgery. DISCONTINUE Non-Steroidal Anti-Inflammatory (NSAIDS) such as Advil and Aleve 5 days prior to surgery.     Home Medications to Hold- please continue all other medications except these.            Comments      On the day before surgery please take 2 Tylenol in the morning and then again before bed. You may use either regular or extra strength 02/08/23.           Please do not bring home medications with you on the day of surgery unless otherwise directed by your nurse.  If you are instructed to bring home medications, please give them to your nurse as they will be administered by the nursing staff.    If you have any questions, please call [facility] [phone].    A copy of this note was provided to the patient for reference.

## 2023-01-22 NOTE — Progress Notes (Signed)
cbc,,ua, pt,  within anesthesia guidelines, no follow-up required.K+ 3.3 Phone message sent to scheduler at [facility] office.  Labs automatically routed to ordering provider via Epic documentation.

## 2023-01-22 NOTE — Telephone Encounter (Signed)
Potassium 3.3 Please Review

## 2023-01-22 NOTE — Progress Notes (Signed)
Patient verified name and DOB    Order for consent was not found in EHR  patient verified.     Type 2 surgery, walk in assessment complete.    Labs per surgeon: cbc,bmp,mrsa,ua, pt, t/S ; results pending  Labs per anesthesia protocol: hgb; results pending  EKG: not needed at time of PAT. No Hx CAD, DM or CVA        Patient provided with and instructed on educational handouts including Guide to Surgery, Preventing Surgical Site Infections, Pain Management, and [facility] Brochure.    Patient answered medical/surgical history questions at their best of ability. All prior to admission medications documented in EPIC. Original medication prescription bottle was not visualized during patient appointment.     Patient instructed to hold all vitamins 7 days prior to surgery and NSAIDS 5 days prior to surgery, patient verbalized understanding.     Patient teach back successful and patient demonstrates knowledge of instructions.

## 2023-01-23 LAB — MRSA/STAPH AUREUS DNA
MRSA by PCR: NOT DETECTED
SA by PCR: NOT DETECTED

## 2023-02-09 ENCOUNTER — Inpatient Hospital Stay
Admission: RE | Admit: 2023-02-09 | Discharge: 2023-02-16 | Disposition: A | Discharge status: Home / Self Care | Payer: MEDICARE | Source: Other Acute Inpatient Hospital

## 2023-02-09 DIAGNOSIS — Z6841 Body Mass Index (BMI) 40.0 and over, adult: Secondary | ICD-10-CM | POA: Diagnosis not present

## 2023-02-09 DIAGNOSIS — M4802 Spinal stenosis, cervical region: Secondary | ICD-10-CM | POA: Diagnosis not present

## 2023-02-09 DIAGNOSIS — F410 Panic disorder [episodic paroxysmal anxiety] without agoraphobia: Secondary | ICD-10-CM | POA: Diagnosis not present

## 2023-02-09 DIAGNOSIS — G992 Myelopathy in diseases classified elsewhere: Secondary | ICD-10-CM | POA: Diagnosis not present

## 2023-02-09 DIAGNOSIS — M4312 Spondylolisthesis, cervical region: Secondary | ICD-10-CM | POA: Diagnosis not present

## 2023-02-09 DIAGNOSIS — M542 Cervicalgia: Secondary | ICD-10-CM | POA: Diagnosis not present

## 2023-02-09 DIAGNOSIS — Z981 Arthrodesis status: Secondary | ICD-10-CM | POA: Diagnosis not present

## 2023-02-09 DIAGNOSIS — G959 Disease of spinal cord, unspecified: Secondary | ICD-10-CM | POA: Diagnosis not present

## 2023-02-09 DIAGNOSIS — I158 Other secondary hypertension: Secondary | ICD-10-CM | POA: Diagnosis not present

## 2023-02-09 DIAGNOSIS — M5020 Other cervical disc displacement, unspecified cervical region: Secondary | ICD-10-CM | POA: Diagnosis not present

## 2023-02-09 DIAGNOSIS — M4712 Other spondylosis with myelopathy, cervical region: Secondary | ICD-10-CM | POA: Diagnosis not present

## 2023-02-09 DIAGNOSIS — M19011 Primary osteoarthritis, right shoulder: Secondary | ICD-10-CM | POA: Diagnosis not present

## 2023-02-09 DIAGNOSIS — M48062 Spinal stenosis, lumbar region with neurogenic claudication: Secondary | ICD-10-CM | POA: Diagnosis not present

## 2023-02-09 LAB — TYPE AND SCREEN
ABO/Rh: A POS
Antibody Screen: NEGATIVE

## 2023-02-09 MED ORDER — DIPHENHYDRAMINE HCL 50 MG/ML IJ SOLN
50 | Freq: Four times a day (QID) | INTRAMUSCULAR | Status: AC | PRN
Start: 2023-02-09 — End: ?

## 2023-02-09 MED ORDER — LACTATED RINGERS IV SOLN
INTRAVENOUS | Status: DC
Start: 2023-02-09 — End: 2023-02-09

## 2023-02-09 MED ORDER — PHENYLEPHRINE HCL PRESSORS PF 0.5 MG/5ML IV SOLN
0.5 | INTRAVENOUS | Status: AC
Start: 2023-02-09 — End: ?

## 2023-02-09 MED ORDER — PROPOFOL 200 MG/20ML IV EMUL
200 | INTRAVENOUS | Status: AC
Start: 2023-02-09 — End: ?

## 2023-02-09 MED ORDER — SODIUM CHLORIDE 0.9 % IV SOLN
0.9 | INTRAVENOUS | Status: DC | PRN
Start: 2023-02-09 — End: 2023-02-09

## 2023-02-09 MED ORDER — MIDAZOLAM HCL 2 MG/2ML IJ SOLN
2 | Freq: Once | INTRAMUSCULAR | Status: DC | PRN
Start: 2023-02-09 — End: 2023-02-09
  Administered 2023-02-09: 13:00:00 2 via INTRAVENOUS

## 2023-02-09 MED ORDER — ONDANSETRON HCL 4 MG/2ML IJ SOLN
4 | INTRAMUSCULAR | Status: AC
Start: 2023-02-09 — End: ?

## 2023-02-09 MED ORDER — HYDRALAZINE HCL 20 MG/ML IJ SOLN
20 | Freq: Once | INTRAMUSCULAR | Status: DC | PRN
Start: 2023-02-09 — End: 2023-02-09
  Administered 2023-02-09: 17:00:00 10 via INTRAVENOUS

## 2023-02-09 MED ORDER — HYDROMORPHONE HCL 2 MG/ML IJ SOLN
2 | Freq: Once | INTRAMUSCULAR | Status: DC | PRN
Start: 2023-02-09 — End: 2023-02-09
  Administered 2023-02-09 (×3): .4 via INTRAVENOUS

## 2023-02-09 MED ORDER — KETAMINE HCL 20 MG/2ML IJ SOSY
20 | Freq: Once | INTRAMUSCULAR | Status: DC | PRN
Start: 2023-02-09 — End: 2023-02-09
  Administered 2023-02-09 (×3): 10 via INTRAVENOUS
  Administered 2023-02-09: 13:00:00 20 via INTRAVENOUS

## 2023-02-09 MED ORDER — DIPHENHYDRAMINE HCL 50 MG/ML IJ SOLN
50 | Freq: Once | INTRAMUSCULAR | Status: DC | PRN
Start: 2023-02-09 — End: 2023-02-09

## 2023-02-09 MED ORDER — METHOCARBAMOL 750 MG PO TABS
750 | ORAL_TABLET | Freq: Three times a day (TID) | ORAL | 0 refills | Status: AC | PRN
Start: 2023-02-09 — End: 2023-02-19

## 2023-02-09 MED ORDER — FENTANYL CITRATE (PF) 100 MCG/2ML IJ SOLN
100 | Freq: Once | INTRAMUSCULAR | Status: DC | PRN
Start: 2023-02-09 — End: 2023-02-09
  Administered 2023-02-09 (×2): 50 via INTRAVENOUS

## 2023-02-09 MED ORDER — NEOSTIGMINE METHYLSULFATE 10 MG/10ML IV SOLN
10 | INTRAVENOUS | Status: AC
Start: 2023-02-09 — End: ?

## 2023-02-09 MED ORDER — FENTANYL CITRATE (PF) 100 MCG/2ML IJ SOLN
100 | INTRAMUSCULAR | Status: AC
Start: 2023-02-09 — End: ?

## 2023-02-09 MED ORDER — VANCOMYCIN HCL 1 G IV SOLR
1 | INTRAVENOUS | Status: DC | PRN
Start: 2023-02-09 — End: 2023-02-09
  Administered 2023-02-09: 17:00:00 1000 via TOPICAL

## 2023-02-09 MED ORDER — PHENYLEPHRINE HCL PRESSORS PF 0.5 MG/5ML IV SOLN
0.5 | Freq: Once | INTRAVENOUS | Status: DC | PRN
Start: 2023-02-09 — End: 2023-02-09
  Administered 2023-02-09 (×8): 100 via INTRAVENOUS

## 2023-02-09 MED ORDER — FAMOTIDINE (PF) 20 MG/2ML IV SOLN
20 | Freq: Two times a day (BID) | INTRAVENOUS | Status: AC
Start: 2023-02-09 — End: ?

## 2023-02-09 MED ORDER — KETAMINE HCL 20 MG/2ML IJ SOSY
20 | INTRAMUSCULAR | Status: AC
Start: 2023-02-09 — End: ?

## 2023-02-09 MED ORDER — DEXAMETHASONE SODIUM PHOSPHATE 10 MG/ML IJ SOLN
10 | Freq: Once | INTRAMUSCULAR | Status: DC | PRN
Start: 2023-02-09 — End: 2023-02-09
  Administered 2023-02-09: 14:00:00 10 via INTRAVENOUS

## 2023-02-09 MED ORDER — SENNA-DOCUSATE SODIUM 8.6-50 MG PO TABS
8.6-50 | Freq: Two times a day (BID) | ORAL | Status: AC
Start: 2023-02-09 — End: ?
  Administered 2023-02-10 – 2023-02-16 (×14): 1 via ORAL

## 2023-02-09 MED ORDER — EPHEDRINE SULFATE (PRESSORS) 5 MG/ML IV SOLN
5 | INTRAVENOUS | Status: AC
Start: 2023-02-09 — End: ?

## 2023-02-09 MED ORDER — MIDAZOLAM HCL (PF) 2 MG/2ML IJ SOLN
2 | Freq: Once | INTRAMUSCULAR | Status: DC | PRN
Start: 2023-02-09 — End: 2023-02-09

## 2023-02-09 MED ORDER — OXYCODONE HCL 5 MG PO TABS
5 | ORAL | Status: AC | PRN
Start: 2023-02-09 — End: ?
  Administered 2023-02-09 – 2023-02-12 (×13): 10 mg via ORAL

## 2023-02-09 MED ORDER — HYDROMORPHONE HCL PF 1 MG/ML IJ SOLN
1 | INTRAMUSCULAR | Status: DC | PRN
Start: 2023-02-09 — End: 2023-02-12

## 2023-02-09 MED ORDER — VANCOMYCIN HCL 1 G IV SOLR
1 | INTRAVENOUS | Status: AC
Start: 2023-02-09 — End: ?

## 2023-02-09 MED ORDER — METHOCARBAMOL 750 MG PO TABS
750 | Freq: Three times a day (TID) | ORAL | Status: AC | PRN
Start: 2023-02-09 — End: ?
  Administered 2023-02-11 – 2023-02-12 (×3): 750 mg via ORAL

## 2023-02-09 MED ORDER — EPHEDRINE SULFATE (PRESSORS) 5 MG/ML IV SOLN
5 | Freq: Once | INTRAVENOUS | Status: DC | PRN
Start: 2023-02-09 — End: 2023-02-09
  Administered 2023-02-09: 14:00:00 10 via INTRAVENOUS
  Administered 2023-02-09: 14:00:00 5 via INTRAVENOUS
  Administered 2023-02-09: 16:00:00 15 via INTRAVENOUS

## 2023-02-09 MED ORDER — DIPHENHYDRAMINE HCL 25 MG PO CAPS
25 | Freq: Four times a day (QID) | ORAL | Status: AC | PRN
Start: 2023-02-09 — End: ?
  Administered 2023-02-11 – 2023-02-12 (×2): 25 mg via ORAL

## 2023-02-09 MED ORDER — GLYCOPYRROLATE 0.4 MG/2ML IJ SOLN
0.4 | INTRAMUSCULAR | Status: AC
Start: 2023-02-09 — End: ?

## 2023-02-09 MED ORDER — ROCURONIUM BROMIDE 50 MG/5ML IV SOLN
50 | Freq: Once | INTRAVENOUS | Status: DC | PRN
Start: 2023-02-09 — End: 2023-02-09
  Administered 2023-02-09 (×2): 20 via INTRAVENOUS
  Administered 2023-02-09 (×3): 10 via INTRAVENOUS
  Administered 2023-02-09: 13:00:00 40 via INTRAVENOUS

## 2023-02-09 MED ORDER — HYDROMORPHONE HCL 1 MG/ML IJ SOLN
1 | INTRAMUSCULAR | Status: DC | PRN
Start: 2023-02-09 — End: 2023-02-09
  Administered 2023-02-09 (×3): 0.5 mg via INTRAVENOUS

## 2023-02-09 MED ORDER — LIDOCAINE IN D5W 4-5 MG/ML-% IV SOLN
4-5 | INTRAVENOUS | Status: DC | PRN
Start: 2023-02-09 — End: 2023-02-09
  Administered 2023-02-09: 14:00:00 1.5 via INTRAVENOUS

## 2023-02-09 MED ORDER — ACETAMINOPHEN 325 MG PO TABS
325 | Freq: Four times a day (QID) | ORAL | Status: AC
Start: 2023-02-09 — End: ?
  Administered 2023-02-09 – 2023-02-16 (×27): 650 mg via ORAL

## 2023-02-09 MED ORDER — TRANEXAMIC ACID 1000 MG/10ML IV SOLN
1000 | Freq: Once | INTRAVENOUS | Status: DC | PRN
Start: 2023-02-09 — End: 2023-02-09
  Administered 2023-02-09 (×2): 1000 via INTRAVENOUS

## 2023-02-09 MED ORDER — CEFAZOLIN SODIUM 1 G IJ SOLR
1 | INTRAMUSCULAR | Status: AC
Start: 2023-02-09 — End: ?

## 2023-02-09 MED ORDER — NOZIN NASAL SANITIZER 62 % NA KIT
62 | Freq: Once | NASAL | Status: AC
Start: 2023-02-09 — End: 2023-02-09
  Administered 2023-02-09: 11:00:00 3 via NASAL

## 2023-02-09 MED ORDER — LIDOCAINE HCL (PF) 2 % IJ SOLN
2 | INTRAMUSCULAR | Status: AC
Start: 2023-02-09 — End: ?

## 2023-02-09 MED ORDER — SODIUM CHLORIDE 0.9 % IV SOLN
0.9 | INTRAVENOUS | Status: DC | PRN
Start: 2023-02-09 — End: 2023-02-09
  Administered 2023-02-09 (×2): via INTRAVENOUS

## 2023-02-09 MED ORDER — SODIUM CHLORIDE (PF) 0.9 % IJ SOLN
0.9 | INTRAMUSCULAR | Status: DC | PRN
Start: 2023-02-09 — End: 2023-02-09

## 2023-02-09 MED ORDER — ONDANSETRON HCL 4 MG/2ML IJ SOLN
4 | Freq: Once | INTRAMUSCULAR | Status: DC | PRN
Start: 2023-02-09 — End: 2023-02-09
  Administered 2023-02-09: 14:00:00 4 via INTRAVENOUS

## 2023-02-09 MED ORDER — MIDAZOLAM HCL 2 MG/2ML IJ SOLN
2 | INTRAMUSCULAR | Status: AC
Start: 2023-02-09 — End: ?

## 2023-02-09 MED ORDER — ONDANSETRON HCL 4 MG/2ML IJ SOLN
4 | Freq: Once | INTRAMUSCULAR | Status: DC | PRN
Start: 2023-02-09 — End: 2023-02-09

## 2023-02-09 MED ORDER — STERILE WATER FOR INJECTION (MIXTURES ONLY)
2 | Freq: Three times a day (TID) | Status: AC
Start: 2023-02-09 — End: 2023-02-10
  Administered 2023-02-09 – 2023-02-10 (×2): 2000 mg via INTRAVENOUS

## 2023-02-09 MED ORDER — ONDANSETRON 4 MG PO TBDP
4 | Freq: Three times a day (TID) | ORAL | Status: AC | PRN
Start: 2023-02-09 — End: ?
  Administered 2023-02-10: 18:00:00 4 mg via ORAL

## 2023-02-09 MED ORDER — FAMOTIDINE 20 MG PO TABS
20 | Freq: Two times a day (BID) | ORAL | Status: AC
Start: 2023-02-09 — End: ?
  Administered 2023-02-09 – 2023-02-16 (×15): 20 mg via ORAL

## 2023-02-09 MED ORDER — STERILE WATER FOR INJECTION (MIXTURES ONLY)
2 | Status: AC
Start: 2023-02-09 — End: 2023-02-09
  Administered 2023-02-09: 14:00:00 2000 mg via INTRAVENOUS

## 2023-02-09 MED ORDER — DEXMEDETOMIDINE HCL IN NACL 400 MCG/100ML IV SOLN
400 | INTRAVENOUS | Status: AC
Start: 2023-02-09 — End: ?

## 2023-02-09 MED ORDER — PROPOFOL 200 MG/20ML IV EMUL
200 | Freq: Once | INTRAVENOUS | Status: DC | PRN
Start: 2023-02-09 — End: 2023-02-09
  Administered 2023-02-09: 13:00:00 100 via INTRAVENOUS
  Administered 2023-02-09: 13:00:00 200 via INTRAVENOUS
  Administered 2023-02-09: 13:00:00 50 via INTRAVENOUS

## 2023-02-09 MED ORDER — DEXAMETHASONE SODIUM PHOSPHATE 10 MG/ML IJ SOLN
10 | Freq: Three times a day (TID) | INTRAMUSCULAR | Status: AC
Start: 2023-02-09 — End: 2023-02-10
  Administered 2023-02-09 – 2023-02-10 (×3): 8 mg via INTRAVENOUS

## 2023-02-09 MED ORDER — NEOSTIGMINE METHYLSULFATE 10 MG/10ML IV SOLN
10 | Freq: Once | INTRAVENOUS | Status: DC | PRN
Start: 2023-02-09 — End: 2023-02-09
  Administered 2023-02-09: 17:00:00 5 via INTRAVENOUS

## 2023-02-09 MED ORDER — LIDOCAINE IN D5W 4-5 MG/ML-% IV SOLN
4-5 | INTRAVENOUS | Status: AC
Start: 2023-02-09 — End: 2023-02-10
  Administered 2023-02-09: 17:00:00 1 mg/kg/h via INTRAVENOUS

## 2023-02-09 MED ORDER — GLYCOPYRROLATE 0.4 MG/2ML IJ SOLN
0.4 | Freq: Once | INTRAMUSCULAR | Status: DC | PRN
Start: 2023-02-09 — End: 2023-02-09
  Administered 2023-02-09: 17:00:00 .8 via INTRAVENOUS

## 2023-02-09 MED ORDER — DEXMEDETOMIDINE HCL IN NACL 400 MCG/100ML IV SOLN
400 | INTRAVENOUS | Status: DC | PRN
Start: 2023-02-09 — End: 2023-02-09
  Administered 2023-02-09: 14:00:00 .5 via INTRAVENOUS

## 2023-02-09 MED ORDER — ESMOLOL HCL 100 MG/10ML IV SOLN
100 | Freq: Once | INTRAVENOUS | Status: DC | PRN
Start: 2023-02-09 — End: 2023-02-09
  Administered 2023-02-09: 13:00:00 30 via INTRAVENOUS
  Administered 2023-02-09: 17:00:00 20 via INTRAVENOUS

## 2023-02-09 MED ORDER — ROCURONIUM BROMIDE 50 MG/5ML IV SOLN
50 | INTRAVENOUS | Status: AC
Start: 2023-02-09 — End: ?

## 2023-02-09 MED ORDER — HYDROMORPHONE HCL 2 MG/ML IJ SOLN
2 | INTRAMUSCULAR | Status: AC
Start: 2023-02-09 — End: ?

## 2023-02-09 MED ORDER — NORMAL SALINE FLUSH 0.9 % IV SOLN
0.9 | INTRAVENOUS | Status: DC | PRN
Start: 2023-02-09 — End: 2023-02-09

## 2023-02-09 MED ORDER — FENTANYL CITRATE (PF) 100 MCG/2ML IJ SOLN
100 | Freq: Once | INTRAMUSCULAR | Status: DC | PRN
Start: 2023-02-09 — End: 2023-02-09

## 2023-02-09 MED ORDER — HYDROMORPHONE HCL PF 1 MG/ML IJ SOLN
1 | INTRAMUSCULAR | Status: AC | PRN
Start: 2023-02-09 — End: ?
  Administered 2023-02-09 – 2023-02-12 (×9): 0.5 mg via INTRAVENOUS

## 2023-02-09 MED ORDER — BISACODYL 5 MG PO TBEC
5 | Freq: Every day | ORAL | Status: AC
Start: 2023-02-09 — End: ?
  Administered 2023-02-09 – 2023-02-16 (×8): 5 mg via ORAL

## 2023-02-09 MED ORDER — NORMAL SALINE FLUSH 0.9 % IV SOLN
0.9 | Freq: Two times a day (BID) | INTRAVENOUS | Status: DC
Start: 2023-02-09 — End: 2023-02-09

## 2023-02-09 MED ORDER — OXYCODONE HCL 5 MG PO TABS
5 | Freq: Once | ORAL | Status: DC | PRN
Start: 2023-02-09 — End: 2023-02-09

## 2023-02-09 MED ORDER — TRANEXAMIC ACID 1000 MG/10ML IV SOLN
1000 | INTRAVENOUS | Status: AC
Start: 2023-02-09 — End: ?

## 2023-02-09 MED ORDER — DEXAMETHASONE SODIUM PHOSPHATE 10 MG/ML IJ SOLN
10 | INTRAMUSCULAR | Status: AC
Start: 2023-02-09 — End: ?

## 2023-02-09 MED ORDER — ACETAMINOPHEN 500 MG PO TABS
500 | Freq: Once | ORAL | Status: DC
Start: 2023-02-09 — End: 2023-02-09

## 2023-02-09 MED ORDER — SODIUM CHLORIDE 0.9 % IV SOLN
0.9 | INTRAVENOUS | Status: AC
Start: 2023-02-09 — End: ?

## 2023-02-09 MED ORDER — ONDANSETRON HCL 4 MG/2ML IJ SOLN
4 | Freq: Four times a day (QID) | INTRAMUSCULAR | Status: AC | PRN
Start: 2023-02-09 — End: ?

## 2023-02-09 MED ORDER — OXYCODONE HCL 5 MG PO TABS
5 | ORAL | Status: DC | PRN
Start: 2023-02-09 — End: 2023-02-12

## 2023-02-09 MED ORDER — DOCUSATE SODIUM 100 MG PO CAPS
100 | ORAL_CAPSULE | Freq: Two times a day (BID) | ORAL | 0 refills | Status: AC
Start: 2023-02-09 — End: 2023-03-11

## 2023-02-09 MED ORDER — OXYCODONE-ACETAMINOPHEN 5-325 MG PO TABS
5-325 | ORAL_TABLET | Freq: Four times a day (QID) | ORAL | 0 refills | Status: AC | PRN
Start: 2023-02-09 — End: 2023-02-16

## 2023-02-09 MED FILL — GLYCOPYRROLATE 0.4 MG/2ML IJ SOLN: 0.4 MG/2ML | INTRAMUSCULAR | Qty: 2

## 2023-02-09 MED FILL — HYDROMORPHONE HCL 1 MG/ML IJ SOLN: 1 MG/ML | INTRAMUSCULAR | Qty: 0.5

## 2023-02-09 MED FILL — TRANEXAMIC ACID 1000 MG/10ML IV SOLN: 1000 MG/10ML | INTRAVENOUS | Qty: 20

## 2023-02-09 MED FILL — VANCOMYCIN HCL 1 G IV SOLR: 1 g | INTRAVENOUS | Qty: 1000

## 2023-02-09 MED FILL — HYDROMORPHONE HCL 1 MG/ML IJ SOLN: 1 MG/ML | INTRAMUSCULAR | Qty: 1

## 2023-02-09 MED FILL — DEXAMETHASONE SODIUM PHOSPHATE 10 MG/ML IJ SOLN: 10 MG/ML | INTRAMUSCULAR | Qty: 1

## 2023-02-09 MED FILL — DIPHENHYDRAMINE HCL 25 MG PO CAPS: 25 MG | ORAL | Qty: 1

## 2023-02-09 MED FILL — KETAMINE HCL 20 MG/2ML IJ SOSY: 20 MG/2ML | INTRAMUSCULAR | Qty: 6

## 2023-02-09 MED FILL — CEFAZOLIN SODIUM 1 G IJ SOLR: 1 g | INTRAMUSCULAR | Qty: 1000

## 2023-02-09 MED FILL — OXYCODONE HCL 5 MG PO TABS: 5 MG | ORAL | Qty: 2

## 2023-02-09 MED FILL — DIPRIVAN 200 MG/20ML IV EMUL: 200 MG/20ML | INTRAVENOUS | Qty: 20

## 2023-02-09 MED FILL — METHOCARBAMOL 750 MG PO TABS: 750 MG | ORAL | Qty: 1

## 2023-02-09 MED FILL — ROCURONIUM BROMIDE 50 MG/5ML IV SOLN: 50 MG/5ML | INTRAVENOUS | Qty: 5

## 2023-02-09 MED FILL — BISACODYL EC 5 MG PO TBEC: 5 MG | ORAL | Qty: 1

## 2023-02-09 MED FILL — CEFAZOLIN SODIUM 2 G IJ SOLR: 2 g | INTRAMUSCULAR | Qty: 2000

## 2023-02-09 MED FILL — HYDROMORPHONE HCL 2 MG/ML IJ SOLN: 2 MG/ML | INTRAMUSCULAR | Qty: 1

## 2023-02-09 MED FILL — DEXMEDETOMIDINE HCL IN NACL 400 MCG/100ML IV SOLN: 400 MCG/100ML | INTRAVENOUS | Qty: 100

## 2023-02-09 MED FILL — FAMOTIDINE 20 MG PO TABS: 20 MG | ORAL | Qty: 1

## 2023-02-09 MED FILL — SODIUM CHLORIDE 0.9 % IV SOLN: 0.9 % | INTRAVENOUS | Qty: 100

## 2023-02-09 MED FILL — ACETAMINOPHEN 325 MG PO TABS: 325 MG | ORAL | Qty: 2

## 2023-02-09 MED FILL — FENTANYL CITRATE (PF) 100 MCG/2ML IJ SOLN: 100 MCG/2ML | INTRAMUSCULAR | Qty: 2

## 2023-02-09 MED FILL — ONDANSETRON HCL 4 MG/2ML IJ SOLN: 4 MG/2ML | INTRAMUSCULAR | Qty: 2

## 2023-02-09 MED FILL — XYLOCAINE-MPF 2 % IJ SOLN: 2 % | INTRAMUSCULAR | Qty: 5

## 2023-02-09 MED FILL — BIORPHEN 0.5 MG/5ML IV SOLN: 0.5 MG/5ML | INTRAVENOUS | Qty: 5

## 2023-02-09 MED FILL — EPHEDRINE SULFATE (PRESSORS) 5 MG/ML IV SOLN: 5 MG/ML | INTRAVENOUS | Qty: 10

## 2023-02-09 MED FILL — NEOSTIGMINE METHYLSULFATE 10 MG/10ML IV SOLN: 10 MG/ML | INTRAVENOUS | Qty: 10

## 2023-02-09 MED FILL — ACETAMINOPHEN EXTRA STRENGTH 500 MG PO TABS: 500 MG | ORAL | Qty: 2

## 2023-02-09 MED FILL — MIDAZOLAM HCL 2 MG/2ML IJ SOLN: 2 MG/ML | INTRAMUSCULAR | Qty: 2

## 2023-02-09 NOTE — Plan of Care (Signed)
 Problem: Pain  Goal: Verbalizes/displays adequate comfort level or baseline comfort level  Outcome: Progressing     Problem: Safety - Adult  Goal: Free from fall injury  Outcome: Progressing     Problem: Discharge Planning  Goal: Discharge to home or oth

## 2023-02-09 NOTE — Other (Signed)
 TRANSFER - OUT REPORT:    Verbal report given to [facility] RN on [employer]  being transferred to 717 for routine post-op       Report consisted of patient's Situation, Background, Assessment and   Recommendations(SBAR).     Information from the fo

## 2023-02-09 NOTE — H&P (Signed)
 Name: Kristin [name]  Date of Birth: March 12, 1960  Gender: female  MRN: [id number]  Age: 62 y.o.     Chief Complaint: EMG follow-up  History of present illness:     This is a very pleasant 62 y.o. female who presents with acute on chronic history o

## 2023-02-09 NOTE — Anesthesia Post-Procedure Evaluation (Signed)
 Department of Anesthesiology  Postprocedure Note    Patient: Kristin [name]  MRN: [id number]  Birthdate: 16-Apr-1960  Date of evaluation: 02/09/2023    Procedure Summary       Date: 02/09/23 Room / Location: [facility] MAIN OR 07 / [facility] MAIN OR    Anesthesia S

## 2023-02-09 NOTE — Discharge Instructions (Signed)
 Discharge Instructions - Spine Surgery    Wound Care and Showering  Your wound will typically be covered with a clear mesh and glue, which is waterproof sufficient for showering but not soaking in a bath. If there is some drainage or bleeding from

## 2023-02-09 NOTE — Progress Notes (Signed)
 ACUTE PHYSICAL THERAPY GOALS:   (Developed with and agreed upon by patient and/or caregiver.)    LTG:  (1.)Kristin [name] will move from supine to sit and sit to supine , scoot up and down, and roll side to side in bed with INDEPENDENT within 7 treatment day(

## 2023-02-09 NOTE — Anesthesia Pre-Procedure Evaluation (Addendum)
 Department of Anesthesiology  Preprocedure Note       Name:  Kristin [name]   Age:  62 y.o.  DOB:  September 23, 1960                                          MRN:  [id number]         Date:  02/09/2023      Surgeon: Moishe Spice):  Aliene Altes, MD    Proc

## 2023-02-09 NOTE — Op Note (Addendum)
 [facility]   [address]   [place], [place]. [address]   [phone]    OPERATIVE REPORT    Patient GN:FAOZH Kristin [name]  [id number]  [birth year]  62 y.o.    DATE OF SURGERY: 02/09/2023    SURGEON: Quita Skye. Levonne Hubert, MD    PREOPERATIVE DIAGNO

## 2023-02-09 NOTE — Progress Notes (Signed)
 Occupational Therapy Note:    Attempted to see patient this PM for occupational therapy evaluation session. Patient requesting to hold off on therapy for now as she just received pain medication - finished with PT. Will follow and re-attempt as schedule pe

## 2023-02-10 MED FILL — HYDROMORPHONE HCL 1 MG/ML IJ SOLN: 1 MG/ML | INTRAMUSCULAR | Qty: 1

## 2023-02-10 MED FILL — SENNOSIDES-DOCUSATE SODIUM 8.6-50 MG PO TABS: 8.6-50 MG | ORAL | Qty: 1

## 2023-02-10 MED FILL — ACETAMINOPHEN 325 MG PO TABS: 325 MG | ORAL | Qty: 2

## 2023-02-10 MED FILL — OXYCODONE HCL 5 MG PO TABS: 5 MG | ORAL | Qty: 2

## 2023-02-10 MED FILL — FAMOTIDINE 20 MG PO TABS: 20 MG | ORAL | Qty: 1

## 2023-02-10 MED FILL — DEXAMETHASONE SODIUM PHOSPHATE 10 MG/ML IJ SOLN: 10 MG/ML | INTRAMUSCULAR | Qty: 1

## 2023-02-10 MED FILL — BISACODYL EC 5 MG PO TBEC: 5 MG | ORAL | Qty: 1

## 2023-02-10 MED FILL — CEFAZOLIN SODIUM 2 G IJ SOLR: 2 g | INTRAMUSCULAR | Qty: 2000

## 2023-02-10 MED FILL — ONDANSETRON 4 MG PO TBDP: 4 MG | ORAL | Qty: 1

## 2023-02-10 NOTE — Plan of Care (Signed)
 Problem: Pain  Goal: Verbalizes/displays adequate comfort level or baseline comfort level  02/10/2023 0955 by Tad Eye, RN  Outcome: Progressing  02/10/2023 0237 by Lia Rede, GN  Outcome: Progressing  Flowsheets (Taken 02/09/2023 1930)  Verbalizes/displays adequate comfort level or baseline comfort level: Encourage patient to monitor pain and request assistance     Problem: Safety - Adult  Goal: Free from fall injury  02/10/2023 0955 by Tad Eye, RN  Outcome: Progressing  02/10/2023 0237 by Lia Rede, GN  Outcome: Progressing  Flowsheets (Taken 02/09/2023 1930)  Free From Fall Injury: Instruct family/caregiver on patient safety     Problem: Discharge Planning  Goal: Discharge to home or other facility with appropriate resources  02/10/2023 0955 by Tad Eye, RN  Outcome: Progressing  02/10/2023 0237 by Lia Rede, GN  Outcome: Progressing  Flowsheets (Taken 02/09/2023 1930)  Discharge to home or other facility with appropriate resources:   Identify barriers to discharge with patient and caregiver   Arrange for needed discharge resources and transportation as appropriate   Identify discharge learning needs (meds, wound care, etc)   Refer to discharge planning if patient needs post-hospital services based on physician order or complex needs related to functional status, cognitive ability or social support system     Problem: ABCDS Injury Assessment  Goal: Absence of physical injury  02/10/2023 0955 by Tad Eye, RN  Outcome: Progressing  02/10/2023 0237 by Lia Rede, GN  Outcome: Progressing  Flowsheets (Taken 02/09/2023 1930)  Absence of Physical Injury: Implement safety measures based on patient assessment

## 2023-02-10 NOTE — Plan of Care (Signed)
 Problem: Pain  Goal: Verbalizes/displays adequate comfort level or baseline comfort level  02/10/2023 2310 by Lia Rede, GN  Outcome: Progressing  Flowsheets (Taken 02/10/2023 2000)  Verbalizes/displays adequate comfort level or baseline comfort level: Encourage patient to monitor pain and request assistance     Problem: Discharge Planning  Goal: Discharge to home or other facility with appropriate resources  02/10/2023 2310 by Lia Rede, GN  Outcome: Progressing  Flowsheets (Taken 02/10/2023 2000)  Discharge to home or other facility with appropriate resources:   Identify barriers to discharge with patient and caregiver   Identify discharge learning needs (meds, wound care, etc)   Arrange for needed discharge resources and transportation as appropriate   Refer to discharge planning if patient needs post-hospital services based on physician order or complex needs related to functional status, cognitive ability or social support system     Problem: ABCDS Injury Assessment  Goal: Absence of physical injury  02/10/2023 2310 by Lia Rede, GN  Outcome: Progressing  Flowsheets (Taken 02/10/2023 2000)  Absence of Physical Injury: Implement safety measures based on patient assessment        Problem: Safety - Adult  Goal: Free from fall injury  02/10/2023 2310 by Lia Rede, GN  Outcome: Progressing  Flowsheets (Taken 02/10/2023 2000)  Free From Fall Injury: Instruct family/caregiver on patient safety

## 2023-02-10 NOTE — Progress Notes (Signed)
 ACUTE OCCUPATIONAL THERAPY GOALS:   (Developed with and agreed upon by patient and/or caregiver.)  1. Kristin [name] will be modified independent with functional mobility for ADL in room within 4 - 7 visits.  2. Kristin [name] will be modified independent with total body bathing and dressing within 4 - 7 visits.  3. Kristin [name] will state and demonstrate at least 3 adaptive strategies/techniques during ADL/therapeutic activities within 4 - 7 visits.  4. Kristin [name] will voice a plan for 2-3 appropriate home modifications for home safety and fall prevention within 7 visits.  5. Kristin [name] will participate in at least 30 minutes of ADL with 3 or less rest breaks within 7 visits.  6. Kristin [name] will complete a tub or shower transfer with modified independence within 7 visits.    OCCUPATIONAL THERAPY Initial Assessment, Daily Note, and PM       OT Visit Days: 1  Acknowledge Orders  Time  OT Charge Capture  Rehab Caseload Tracker      Kristin [name] is a 62 y.o. female   PRIMARY DIAGNOSIS: S/P cervical spinal fusion  Cervical myelopathy (HCC) [G95.9]  S/P cervical spinal fusion [Z98.1]  Procedure(s) (LRB):  ERAS C3-C7 laminectomy and C3-C7 posterior fusion with allograft and instrumentation (N/A)  1 Day Post-Op  Reason for Referral: Generalized Muscle Weakness (M62.81)  Inpatient: Payor: [employer] MEDICARE / Plan: [employer] GOLD PLUS HMO / Product Type: *No Product type* /     ASSESSMENT:     REHAB RECOMMENDATIONS:   Recommendation to date pending progress:  Setting:  Home Health Therapy    Equipment:    To Be Determined     ASSESSMENT:  Kristin [name] is s/p the above procedure. Has a neck brace.  She presented supine in bed and agreeable to OT.  She completed log rolling and R sidelying to sitting with supervision.  She then completed bed to chair transfers with the same.  Following this, she completed sit to stand and functional mobility to sink where she  completed grooming/oral care with standby assistance.  She then completed functional mobility back to chair and was situated and left up with all needs in reach/sister/RN at bedside.  Kristin [name] presents with the following problems and would benefit from occupational therapy to maximize independence with self-care,instrumental activities of daily living, and functional transfers/mobility for activities of daily living/instrumental activities of daily living via the stated goals.     [name] AM-PACT "6 Clicks" Daily Activity Inpatient Short Form:    AM-PAC Daily Activity - Inpatient   How much help is needed for putting on and taking off regular lower body clothing?: A Little  How much help is needed for bathing (which includes washing, rinsing, drying)?: A Little  How much help is needed for toileting (which includes using toilet, bedpan, or urinal)?: A Little  How much help is needed for putting on and taking off regular upper body clothing?: A Little  How much help is needed for taking care of personal grooming?: A Little  How much help for eating meals?: A Little  AM-PAC Inpatient Daily Activity Raw Score: 18  AM-PAC Inpatient ADL T-Scale Score : 38.66  ADL Inpatient CMS 0-100% Score: 46.65  ADL Inpatient CMS G-Code Modifier : CK           SUBJECTIVE:     Kristin [name] states, "Ok."     Social/Functional  Planning to stay with daughter post hospitalization.  OBJECTIVE:     LINES / DRAINS / AIRWAY: IV and JP Drain    RESTRICTIONS/PRECAUTIONS:  Position Activity Restriction  Spinal Precautions: No Bending, No Lifting, No Twisting  Required Braces or Orthoses  Cervical: soft    PAIN: VITALS / O2:   Pre Treatment:   Pain Level: 8      Post Treatment: no specific rating given       Vitals          Oxygen            GROSS EVALUATION: INTACT IMPAIRED   (See Comments)   UE AROM [x]  [] Shoulder elevation not tested   UE PROM []  []    Strength []  LUE Strength  Gross LUE Strength: Exceptions to [facility]  LUE  Strength Comment: Generally decreased, functional for ADL  RUE Strength  Gross RUE Strength: Exceptions to [facility]  RUE Strength Comment: Generally decreased, functional for ADL     Posture / Balance []  Sitting - Static: Good  Sitting - Dynamic: Good  Standing - Static: Fair  Standing - Dynamic: Fair   Sensation []      Coordination []        Tone []        Edema []     Activity Tolerance []  Patient Tolerated treatment well     Hand Dominance R []  L []       COGNITION/  PERCEPTION: INTACT IMPAIRED   (See Comments)   Orientation [x]      Vision [x]      Hearing [x]      Cognition  [x]      Perception [x]        MOBILITY: I Mod I S SBA CGA Min Mod Max Total  NT x2 Comments:   Bed Mobility    Rolling []  []  [x]  []  []  []  []  []  []  []  []     Supine to Sit []  []  [x]  []  []  []  []  []  []  []  []     Scooting []  []  [x]  []  []  []  []  []  []  []  []     Sit to Supine []  []  []  []  []  []  []  []  []  [x]  []     Transfers    Sit to Stand []  []  [x]  []  []  []  []  []  []  []  []     Bed to Chair []  []  [x]  []  []  []  []  []  []  []  []     Stand to Sit []  []  [x]  []  []  []  []  []  []  []  []     Tub/Shower []  []  []  []  []  []  []  []  []  [x]  []      Toilet []  []  []  []  []  []  []  []  []  [x]  []       []  []  []  []  []  []  []  []  []  []  []     I=Independent, Mod I=Modified Independent, S=Supervision/Setup, SBA=Standby Assistance, CGA=Contact Guard Assistance, Min=Minimal Assistance, Mod=Moderate Assistance, Max=Maximal Assistance, Total=Total Assistance, NT=Not Tested    ACTIVITIES OF DAILY LIVING: I Mod I S SBA CGA Min Mod Max Total NT Comments   BASIC ADLs:              Upper Body Bathing  []  []  []  []  []  []  []  []  []  [x]      Lower Body Bathing []  []  []  []  []  []  []  []  []  [x]      Toileting []  []  []  []  []  []  []  []  []  [x]     Upper Body Dressing []  []  []  []  []  []  []  []  []  [x]     Lower Body Dressing []  []  []  []  []  []  []  []  []  [  x]    Feeding []  []  []  []  []  []  []  []  []  [x]     Grooming []  []  []  [x]  []  []  []  []  []  []  Brush hair/teeth   Personal Device Care []  []  []  []  []  []  []  []  []  [x]     Functional Mobility []  []   [x]  []  []  []  []  []  []  []     I=Independent, Mod I=Modified Independent, S=Supervision/Setup, SBA=Standby Assistance, CGA=Contact Guard Assistance, Min=Minimal Assistance, Mod=Moderate Assistance, Max=Maximal Assistance, Total=Total Assistance, NT=Not Tested    PLAN:   FREQUENCY/DURATION   OT Plan of Care: 3 times/week for duration of hospital stay or until stated goals are met, whichever comes first.    PROBLEM LIST:   (Skilled intervention is medically necessary to address:)  Decreased ADL/Functional Activities  Decreased Activity Tolerance  Decreased AROM/PROM  Decreased Balance  Decreased Strength  Decreased Transfer Abilities   INTERVENTIONS PLANNED:  (Benefits and precautions of occupational therapy have been discussed with the patient.)  Self Care Training  Therapeutic Activity  Therapeutic Exercise/HEP  Neuromuscular Re-education  Manual Therapy  Education         TREATMENT:     EVALUATION: LOW COMPLEXITY: (Untimed Charge)  The initial evaluation charge encompasses clinical chart review, objective assessment, interpretation of assessment, and skilled monitoring of the patient's response to treatment in order to develop a plan of care.     TREATMENT:   Self Care (10 minutes): Patient participated in grooming, functional mobility, and functional transfer in standing with no visual, verbal, manual, and tactile cueing to increase independence, decrease assistance required, increase activity tolerance, and maintain precautions. The patient and family was educated on role of occupational therapy and compensatory technique during ADL  and patient verbalized understanding and demonstrated understanding.     TREATMENT GRID:  N/A    AFTER TREATMENT PRECAUTIONS: Bed/Chair Locked, Call light within reach, Chair, Needs within reach, and RN at bedside    INTERDISCIPLINARY COLLABORATION:  RN/ PCT and OT/ COTA    EDUCATION:  Education Given To: Patient;Family  Education Provided: Role of Therapy;Plan of Care  Education  Method: Verbal;Demonstration  Barriers to Learning: None  Education Outcome: Verbalized understanding;Demonstrated understanding    TOTAL TREATMENT DURATION AND TIME:  Time In: 1258  Time Out: 1317  Minutes: [address] O'Brien, OT

## 2023-02-10 NOTE — Plan of Care (Signed)
 Problem: Pain  Goal: Verbalizes/displays adequate comfort level or baseline comfort level  02/10/2023 0237 by Lia Rede, GN  Outcome: Progressing  Flowsheets (Taken 02/09/2023 1930)  Verbalizes/displays adequate comfort level or baseline comfort level: Encourage patient to monitor pain and request assistance     Problem: Safety - Adult  Goal: Free from fall injury  02/10/2023 0237 by Lia Rede, GN  Outcome: Progressing  Flowsheets (Taken 02/09/2023 1930)  Free From Fall Injury: Instruct family/caregiver on patient safety     Problem: Discharge Planning  Goal: Discharge to home or other facility with appropriate resources  02/10/2023 0237 by Lia Rede, GN  Outcome: Progressing  Flowsheets (Taken 02/09/2023 1930)  Discharge to home or other facility with appropriate resources:   Identify barriers to discharge with patient and caregiver   Arrange for needed discharge resources and transportation as appropriate   Identify discharge learning needs (meds, wound care, etc)   Refer to discharge planning if patient needs post-hospital services based on physician order or complex needs related to functional status, cognitive ability or social support system     Problem: ABCDS Injury Assessment  Goal: Absence of physical injury  02/10/2023 0237 by Lia Rede, GN  Outcome: Progressing  Flowsheets (Taken 02/09/2023 1930)  Absence of Physical Injury: Implement safety measures based on patient assessment

## 2023-02-10 NOTE — Care Coordination-Inpatient (Signed)
 Chart reviewed by case manager for potential transition of care (TOC) needs or concerns.  Pt is insured with pharmacy benefits and is established with a PCP.  Therapy evals complete with the recommendation for [facility] therapy at dc. DME recommendations pending.  CM met with pt/sister at the bedside to discuss TOC needs.  Pt confirmed that she is a resident of [place] and plans to return there to stay with her dtr at dc.  Unfortunately, CM is unable to arrange [facility] services in a different state since the surgeon is not licensed in that state. CM suggested that she contact her PCP in [place] for a [facility] referral if she feels the services are needed.  She verbalized understanding.  Pt requested a rolling Dathan Attia for dc. Order faxed to [facility] with the request for the Kristy Schomburg to be delivered to the pt's room tomorrow morning.  MD anticipates pt will be ready for dc tomorrow.  CM remains available to assist as needed.  Please notify/consult case manager if other TOC needs arise.       02/10/23 1549   Service Assessment   Patient Orientation Alert and Oriented   Cognition Alert   History Provided By Patient;Medical Record;Child/Family   Primary Caregiver Self   Accompanied By/Relationship sister   Support Systems Children;Family Members   Patient's Healthcare Decision Maker is: Legal Next of Kin   PCP Verified by CM Yes   Prior Functional Level Independent in ADLs/IADLs   Current Functional Level Assistance with the following:;Mobility;Bathing;Dressing   Can patient return to prior living arrangement Yes   Ability to make needs known: Good   Family able to assist with home care needs: Yes  (pt plans to stay with dtr after discharge.)   Would you like for me to discuss the discharge plan with any other family members/significant others, and if so, who? Yes  (dtr)   Financial Resources Medicare   Social/Functional History   Home Equipment None   Receives Help From Family   Prior Level of Assist for ADLs Independent   Prior Level of  Assist for [employer] Independent   Ambulation Assistance Independent   Prior Level of Assist for Transfers Independent   Active Driver Yes   Occupation On disability   Discharge Planning   Type of Residence Apartment   Living Arrangements Family Members   Current Services Prior To Admission None   Potential Assistance Needed Home Care;Durable Medical Equipment   Potential DME Needed Jonathon Tan   DME Ordered? Leilan Bochenek  (fixed wheel rolling Shenika Quint from [facility])   Potential Assistance Purchasing Medications No   Type of Home Care Services None   Patient expects to be discharged to: Apartment   Services At/After Discharge   Transition of Care Consult (CM Consult) N/A  (no CM consult received)   Services At/After Discharge DME   [employer] Information Provided? No   Mode of Transport at Discharge Other (see comment)  (family)   Confirm Follow Up Transport Family   Condition of Participation: Discharge Planning   The Plan for Transition of Care is related to the following treatment goals: Home with family support to return to baseline functioning.

## 2023-02-10 NOTE — Progress Notes (Signed)
 ORTHO PROGRESS NOTE    February 10, 2023    Admit Date: 02/09/2023  Post Op day: 1 Day Post-Op      Subjective:     Kristin [name] is a patient who is now 1 Day Post-Op  and has no complaints.       Objective:     PT/OT:    Progressing    Vital Signs:    Patient Vitals for the past 8 hrs:   Resp   02/10/23 1341 16   02/10/23 1311 19   02/10/23 1052 16   02/10/23 1022 16   02/10/23 0903 16     Temp (24hrs), Avg:98 F (36.7 C), Min:97.7 F (36.5 C), Max:98.1 F (36.7 C)      LAB:    No results for input(s): "HGB", "WBC", "PLT" in the last 72 hours.    Physical Exam:    Awake and in no acute distress.  Mood and affect appropriate.  Respirations unlabored and no evidence cyanosis.  Calves nontender.  Abdomen soft and nontender.  Dressing clean/dry  No new neurologic deficit.    Assessment:      1 Day Post-Op STATUS POST Procedure(s):  ERAS C3-C7 laminectomy and C3-C7 posterior fusion with allograft and instrumentation      Plan:     D/C drain  Anticipate discharge to: HOME tomorrow      Signed By: Rosco Companion, MD

## 2023-02-10 NOTE — Progress Notes (Signed)
 ACUTE PHYSICAL THERAPY GOALS:   (Developed with and agreed upon by patient and/or caregiver.)  LTG:  (1.)Kristin [name] will move from supine to sit and sit to supine , scoot up and down, and roll side to side in bed with INDEPENDENT within 7 treatment day(s).    (2.)Kristin [name] will transfer from bed to chair and chair to bed with INDEPENDENT using the least restrictive device within 7 treatment day(s).    (3.)Kristin [name] will ambulate with INDEPENDENT for 300 feet with the least restrictive device within 7 treatment day(s).  (4.)Kristin [name] will tolerate at least 23 min of dynamic standing activity to assist standing ADLs with the least restrictive device within 7 treatment days.  (5.)Kristin [name] will climb at least 14 steps with MODIFIED INDEPENDENCE with the least restrictive device within 7 treatment days.    PHYSICAL THERAPY: Daily Note PM   (Link to Caseload Tracking: PT Visit Days : 2  Time In/Out PT Charge Capture  Rehab Caseload Tracker  Orders    Kristin [name] is a 62 y.o. female   PRIMARY DIAGNOSIS: S/P cervical spinal fusion  Cervical myelopathy (HCC) [G95.9]  S/P cervical spinal fusion [Z98.1]  Procedure(s) (LRB):  ERAS C3-C7 laminectomy and C3-C7 posterior fusion with allograft and instrumentation (N/A)  1 Day Post-Op  Inpatient: Payor: [employer] MEDICARE / Plan: [employer] GOLD PLUS HMO / Product Type: *No Product type* /     ASSESSMENT:     REHAB RECOMMENDATIONS:   Recommendation to date pending progress:  Setting:  Home Health Therapy    Equipment:    None     ASSESSMENT:  Kristin [name] is making steady progress toward goals, she reports having just gotten back to bed, but was agreeable to ambulation.  The patient again demonstrated good technique with log rolling in and out bed and transfers.  She does require additional cueing to slow down and maintain a safe speed.  The patient ambulated with a steady gait and good use of the RW.       SUBJECTIVE:   Kristin [name] states, "What do I need to do for  them to let me go home?"     Social/Functional    OBJECTIVE:     PAIN: VITALS / O2: PRECAUTION / LINES / DRAINS:   Pre Treatment:    5      Post Treatment: 5 Vitals        Oxygen    None    RESTRICTIONS/PRECAUTIONS:        MOBILITY: I Mod I S SBA CGA Min Mod Max Total  NT x2 Comments:   Bed Mobility    Rolling []  []  []  []  [x]  []  []  []  []  []  []     Supine to Sit []  []  []  []  [x]  []  []  []  []  []  []     Scooting []  []  []  []  [x]  []  []  []  []  []  []     Sit to Supine []  []  []  []  [x]  []  []  []  []  []  []     Transfers    Sit to Stand []  []  []  []  [x]  []  []  []  []  []  []     Bed to Chair []  []  []  []  [x]  []  []  []  []  []  []     Stand to Sit []  []  []  []  [x]  []  []  []  []  []  []      []  []  []  []  []  []  []  []  []  []  []     I=Independent, Mod I=Modified Independent, S=Supervision, SBA=Standby Assistance, CGA=Contact Guard Assistance,  Min=Minimal Assistance, Mod=Moderate Assistance, Max=Maximal Assistance, Total=Total Assistance, NT=Not Tested    BALANCE: Good Fair+ Fair Fair- Poor NT Comments   Sitting Static []  [x]  []  []  []  []     Sitting Dynamic []  [x]  []  []  []  []               Standing Static []  [x]  []  []  []  []     Standing Dynamic []  []  [x]  []  []  []       GAIT: I Mod I S SBA CGA Min Mod Max Total  NT x2 Comments:   Level of Assistance []  []  []  []  [x]  []  []  []  []  []  []     Distance 120  feet    DME Rolling Walker    Gait Quality Decreased cadence , Decreased step clearance, and Decreased step length    Weightbearing Status      Stairs      I=Independent, Mod I=Modified Independent, S=Supervision, SBA=Standby Assistance, CGA=Contact Guard Assistance,   Min=Minimal Assistance, Mod=Moderate Assistance, Max=Maximal Assistance, Total=Total Assistance, NT=Not Tested    PLAN:   FREQUENCY AND DURATION: BID for duration of hospital stay or until stated goals are met, whichever comes first.    TREATMENT:   TREATMENT:   Therapeutic Activity (13 Minutes): Therapeutic activity included Rolling, Supine to Sit, Sit to Supine, Scooting, Transfer Training, Ambulation  on level ground, Sitting balance , and Standing balance to improve functional Activity tolerance, Balance, Coordination, Mobility, and Strength.    TREATMENT GRID:  N/A    AFTER TREATMENT PRECAUTIONS: Alarm Activated, Bed, Bed/Chair Locked, Call light within reach, Needs within reach, RN notified, and Visitors at bedside    INTERDISCIPLINARY COLLABORATION:  RN/ PCT and PT/ PTA    EDUCATION:      TIME IN/OUT:  Time In: 1423  Time Out: 1436  Minutes: 13    Kristin [name], PTA

## 2023-02-10 NOTE — Progress Notes (Signed)
 ACUTE PHYSICAL THERAPY GOALS:   (Developed with and agreed upon by patient and/or caregiver.)  LTG:  (1.)Kristin [name] will move from supine to sit and sit to supine , scoot up and down, and roll side to side in bed with INDEPENDENT within 7 treatment day(s).    (2.)Kristin [name] will transfer from bed to chair and chair to bed with INDEPENDENT using the least restrictive device within 7 treatment day(s).    (3.)Kristin [name] will ambulate with INDEPENDENT for 300 feet with the least restrictive device within 7 treatment day(s).  (4.)Kristin [name] will tolerate at least 23 min of dynamic standing activity to assist standing ADLs with the least restrictive device within 7 treatment days.  (5.)Kristin [name] will climb at least 14 steps with MODIFIED INDEPENDENCE with the least restrictive device within 7 treatment days.    PHYSICAL THERAPY: Daily Note AM   (Link to Caseload Tracking: PT Visit Days : 2  Time In/Out PT Charge Capture  Rehab Caseload Tracker  Orders    Kristin [name] is a 62 y.o. female   PRIMARY DIAGNOSIS: S/P cervical spinal fusion  Cervical myelopathy (HCC) [G95.9]  S/P cervical spinal fusion [Z98.1]  Procedure(s) (LRB):  ERAS C3-C7 laminectomy and C3-C7 posterior fusion with allograft and instrumentation (N/A)  1 Day Post-Op  Inpatient: Payor: [employer] MEDICARE / Plan: [employer] GOLD PLUS HMO / Product Type: *No Product type* /     ASSESSMENT:     REHAB RECOMMENDATIONS:   Recommendation to date pending progress:  Setting:  Home Health Therapy    Equipment:    None     ASSESSMENT:  Kristin [name] is making slow progress toward goals with increased gait distances and mobility.  The patient performed bed mobility with good log rolling technique and CGA.  She demonstrated good sitting balance, but did report feeling dizzy.  BP assessed in sitting and standing, documented below.  The patient was able to increase gait distance with the RW and CGA, a chair follow for safety.  She ambulated 31' with the RW and CGA,  she demonstrated good posture with decreased step length and did fatigue quickly, but had no loss of balance.       SUBJECTIVE:   Kristin [name] states, "I'll probably stay on the ground floor"     Social/Functional    OBJECTIVE:     PAIN: VITALS / O2: PRECAUTION / LINES / DRAINS:   Pre Treatment:    8      Post Treatment: 8 Vitals        Oxygen    None    RESTRICTIONS/PRECAUTIONS:        MOBILITY: I Mod I S SBA CGA Min Mod Max Total  NT x2 Comments:   Bed Mobility    Rolling []  []  []  []  [x]  []  []  []  []  []  []     Supine to Sit []  []  []  []  [x]  []  []  []  []  []  []     Scooting []  []  []  []  [x]  []  []  []  []  []  []     Sit to Supine []  []  []  []  []  []  []  []  []  [x]  []     Transfers    Sit to Stand []  []  []  []  [x]  []  []  []  []  []  []     Bed to Chair []  []  []  []  [x]  []  []  []  []  []  []     Stand to Sit []  []  []  []  [x]  []  []  []  []  []  []      []  []  []  []  []  []  []  []  []  []  []   I=Independent, Mod I=Modified Independent, S=Supervision, SBA=Standby Assistance, CGA=Contact Guard Assistance,   Min=Minimal Assistance, Mod=Moderate Assistance, Max=Maximal Assistance, Total=Total Assistance, NT=Not Tested    BALANCE: Good Fair+ Fair Fair- Poor NT Comments   Sitting Static []  [x]  []  []  []  []     Sitting Dynamic []  [x]  []  []  []  []               Standing Static []  [x]  []  []  []  []     Standing Dynamic []  []  [x]  []  []  []       GAIT: I Mod I S SBA CGA Min Mod Max Total  NT x2 Comments:   Level of Assistance []  []  []  []  [x]  []  []  []  []  []  []     Distance 60  feet    DME Rolling Walker    Gait Quality Decreased cadence , Decreased step clearance, and Decreased step length    Weightbearing Status      Stairs      I=Independent, Mod I=Modified Independent, S=Supervision, SBA=Standby Assistance, CGA=Contact Guard Assistance,   Min=Minimal Assistance, Mod=Moderate Assistance, Max=Maximal Assistance, Total=Total Assistance, NT=Not Tested    PLAN:   FREQUENCY AND DURATION: BID for duration of hospital stay or until stated goals are met, whichever comes  first.    TREATMENT:   TREATMENT:   Therapeutic Activity (24 Minutes): Therapeutic activity included Rolling, Supine to Sit, Scooting, Transfer Training, Ambulation on level ground, Sitting balance , and Standing balance to improve functional Activity tolerance, Balance, Coordination, Mobility, and Strength.    TREATMENT GRID:  N/A    AFTER TREATMENT PRECAUTIONS: Alarm Activated, Bed/Chair Locked, Call light within reach, Chair, Needs within reach, RN notified, and Visitors at bedside    INTERDISCIPLINARY COLLABORATION:  RN/ PCT and PT/ PTA    EDUCATION:      TIME IN/OUT:  Time In: 0943  Time Out: 1007  Minutes: [address]    [name], PTA

## 2023-02-11 MED ORDER — ALPRAZOLAM 0.25 MG PO TABS
0.25 | Freq: Three times a day (TID) | ORAL | Status: DC | PRN
Start: 2023-02-11 — End: 2023-02-16
  Administered 2023-02-11 – 2023-02-15 (×6): 1 mg via ORAL

## 2023-02-11 MED FILL — ACETAMINOPHEN 325 MG PO TABS: 325 MG | ORAL | Qty: 2

## 2023-02-11 MED FILL — FAMOTIDINE 20 MG PO TABS: 20 MG | ORAL | Qty: 1

## 2023-02-11 MED FILL — BISACODYL EC 5 MG PO TBEC: 5 MG | ORAL | Qty: 1

## 2023-02-11 MED FILL — HYDROMORPHONE HCL 1 MG/ML IJ SOLN: 1 MG/ML | INTRAMUSCULAR | Qty: 1

## 2023-02-11 MED FILL — OXYCODONE HCL 5 MG PO TABS: 5 MG | ORAL | Qty: 2

## 2023-02-11 MED FILL — SENNOSIDES-DOCUSATE SODIUM 8.6-50 MG PO TABS: 8.6-50 MG | ORAL | Qty: 1

## 2023-02-11 MED FILL — METHOCARBAMOL 750 MG PO TABS: 750 MG | ORAL | Qty: 1

## 2023-02-11 MED FILL — DIPHENHYDRAMINE HCL 25 MG PO CAPS: 25 MG | ORAL | Qty: 1

## 2023-02-11 MED FILL — ALPRAZOLAM 0.25 MG PO TABS: 0.25 MG | ORAL | Qty: 4

## 2023-02-11 NOTE — Progress Notes (Signed)
 ORTHO PROGRESS NOTE    February 11, 2023    Admit Date: 02/09/2023  Post Op day: 2 Days Post-Op      Subjective:     Kristin [name] is a patient who is now 2 Days Post-Op . She experienced a panic attack last night. She complains of severe right shoulder blade pain (she says pain is located at the site of the drain that was pulled yesterday). She denies headache, chest pain, shortness of breath, dizziness. She was unable to get out of bed with PT this am. Denies extremity pain    Objective:     PT/OT:    Progressing    Vital Signs:    Patient Vitals for the past 8 hrs:   BP Temp Temp src Pulse Resp SpO2   02/11/23 0801 (!) 168/94 97.2 F (36.2 C) Oral 89 20 96 %   02/11/23 0519 (!) 150/86 -- -- 76 -- 95 %   02/11/23 0413 (!) 176/113 97.3 F (36.3 C) Oral 92 20 91 %     Temp (24hrs), Avg:97.4 F (36.3 C), Min:97.2 F (36.2 C), Max:97.7 F (36.5 C)      LAB:    No results for input(s): HGB, WBC, PLT in the last 72 hours.    Physical Exam:    Awake and in no acute distress.  Mood and affect appropriate.  Respirations unlabored and no evidence cyanosis.  Calves nontender.  Abdomen soft and nontender.  Dressing clean/dry;  there is swelling and fluctuance at the surgical site, no erythema, scant drainage on dressing   No new neurologic deficit.    Assessment:      2 Days Post-Op STATUS POST Procedure(s):  ERAS C3-C7 laminectomy and C3-C7 posterior fusion with allograft and instrumentation      Plan:     Continue current pain control  Continue PT/OT, may d/c to home later today if pain control improves and is able to ambulate      Signed By: Duwaine FORBES Benne, MD

## 2023-02-11 NOTE — Progress Notes (Signed)
 ACUTE PHYSICAL THERAPY GOALS:   (Developed with and agreed upon by patient and/or caregiver.)  LTG:  (1.)[name] will move from supine to sit and sit to supine , scoot up and down, and roll side to side in bed with INDEPENDENT within 7 treatment day(s).    (2.)[name] will transfer from bed to chair and chair to bed with INDEPENDENT using the least restrictive device within 7 treatment day(s).    (3.)[name] will ambulate with INDEPENDENT for 300 feet with the least restrictive device within 7 treatment day(s).  (4.)[name] will tolerate at least 23 min of dynamic standing activity to assist standing ADLs with the least restrictive device within 7 treatment days.  (5.)[name] will climb at least 14 steps with MODIFIED INDEPENDENCE with the least restrictive device within 7 treatment days.    PHYSICAL THERAPY: Daily Note PM   (Link to Caseload Tracking: PT Visit Days : 3  Time In/Out PT Charge Capture  Rehab Caseload Tracker  Orders    Kristin [name] is a 63 y.o. female   PRIMARY DIAGNOSIS: S/P cervical spinal fusion  Cervical myelopathy (HCC) [G95.9]  S/P cervical spinal fusion [Z98.1]  Procedure(s) (LRB):  ERAS C3-C7 laminectomy and C3-C7 posterior fusion with allograft and instrumentation (N/A)  2 Days Post-Op  Inpatient: Payor: [employer] MEDICARE / Plan: [employer] GOLD PLUS HMO / Product Type: *No Product type* /     ASSESSMENT:     REHAB RECOMMENDATIONS:   Recommendation to date pending progress:  Setting:  Home Health Therapy    Equipment:    None     ASSESSMENT:  [name] presents in supine, having just received pain meds. She moves with CGA, but is unable to tolerate OOB activity. Three times she performs the log roll in and out of bed, standing once, but each times reports 10/10 pain her neck and shoulder and the need to lay back down. RN notified, no progress this AM.      SUBJECTIVE:   [name] states, it's killing me     Social/Functional Home Equipment: None  Receives Help  From: Family  Prior Level of Assist for ADLs: Independent  Prior Level of Assist for Homemaking: Independent  Prior Level of Assist for Transfers: Independent  Active Driver: Yes  Occupation: On disability  OBJECTIVE:     PAIN: VITALS / O2: PRECAUTION / LINES / DRAINS:   Pre Treatment:    10      Post Treatment: 10 Vitals        Oxygen    None    RESTRICTIONS/PRECAUTIONS:        MOBILITY: I Mod I S SBA CGA Min Mod Max Total  NT x2 Comments:   Bed Mobility    Rolling []  []  []  []  [x]  []  []  []  []  []  []     Supine to Sit []  []  []  []  [x]  []  []  []  []  []  []     Scooting []  []  []  []  [x]  []  []  []  []  []  []     Sit to Supine []  []  []  []  [x]  []  []  []  []  []  []     Transfers    Sit to Stand []  []  []  []  [x]  []  []  []  []  []  []     Bed to Chair []  []  []  []  []  []  []  []  []  []  []     Stand to Sit []  []  []  []  [x]  []  []  []  []  []  []      []  []  []  []  []  []  []  []  []  []  []   I=Independent, Mod I=Modified Independent, S=Supervision, SBA=Standby Assistance, CGA=Contact Guard Assistance,   Min=Minimal Assistance, Mod=Moderate Assistance, Max=Maximal Assistance, Total=Total Assistance, NT=Not Tested    BALANCE: Good Fair+ Fair Fair- Poor NT Comments   Sitting Static []  [x]  []  []  []  []     Sitting Dynamic []  [x]  []  []  []  []               Standing Static []  [x]  []  []  []  []     Standing Dynamic []  []  [x]  []  []  []       GAIT: I Mod I S SBA CGA Min Mod Max Total  NT x2 Comments:   Level of Assistance []  []  []  []  []  []  []  []  []  [x]  []     Distance   feet    DME Rolling Walker    Gait Quality Decreased cadence , Decreased step clearance, and Decreased step length    Weightbearing Status      Stairs      I=Independent, Mod I=Modified Independent, S=Supervision, SBA=Standby Assistance, CGA=Contact Guard Assistance,   Min=Minimal Assistance, Mod=Moderate Assistance, Max=Maximal Assistance, Total=Total Assistance, NT=Not Tested    PLAN:   FREQUENCY AND DURATION: BID for duration of hospital stay or until stated goals are met, whichever comes first.    TREATMENT:    TREATMENT:   Therapeutic Activity (15 Minutes): Therapeutic activity included Rolling, Supine to Sit, Sit to Supine, Scooting, Sitting balance , and Standing balance to improve functional Activity tolerance, Balance, Coordination, Mobility, and Strength.    TREATMENT GRID:  N/A    AFTER TREATMENT PRECAUTIONS: Alarm Activated, Bed, Bed/Chair Locked, Call light within reach, Needs within reach, RN notified, and Visitors at bedside    INTERDISCIPLINARY COLLABORATION:  RN/ PCT and PT/ PTA    EDUCATION: Education Given To: Patient;Family  Education Provided: Role of Therapy;Plan of Care    TIME IN/OUT:  Time In: 0826  Time Out: 0841  Minutes: 15    Camie FORBES Ko, PT

## 2023-02-11 NOTE — Plan of Care (Signed)
 Problem: Pain  Goal: Verbalizes/displays adequate comfort level or baseline comfort level  02/11/2023 0746 by Craven Sawyer, RN  Outcome: Progressing  02/10/2023 2310 by Mercer Bolt, GN  Outcome: Progressing  Flowsheets (Taken 02/10/2023 2000)  Verbalizes/displays adequate comfort level or baseline comfort level: Encourage patient to monitor pain and request assistance     Problem: Safety - Adult  Goal: Free from fall injury  02/11/2023 0746 by Craven Sawyer, RN  Outcome: Progressing  02/10/2023 2310 by Mercer Bolt, GN  Outcome: Progressing  Flowsheets (Taken 02/10/2023 2000)  Free From Fall Injury: Instruct family/caregiver on patient safety     Problem: Discharge Planning  Goal: Discharge to home or other facility with appropriate resources  02/11/2023 0746 by Craven Sawyer, RN  Outcome: Progressing  Flowsheets (Taken 02/11/2023 0745)  Discharge to home or other facility with appropriate resources: Identify barriers to discharge with patient and caregiver  02/10/2023 2310 by Mercer Bolt, GN  Outcome: Progressing  Flowsheets (Taken 02/10/2023 2000)  Discharge to home or other facility with appropriate resources:   Identify barriers to discharge with patient and caregiver   Identify discharge learning needs (meds, wound care, etc)   Arrange for needed discharge resources and transportation as appropriate   Refer to discharge planning if patient needs post-hospital services based on physician order or complex needs related to functional status, cognitive ability or social support system     Problem: ABCDS Injury Assessment  Goal: Absence of physical injury  02/11/2023 0746 by Craven Sawyer, RN  Outcome: Progressing  02/10/2023 2310 by Mercer Bolt, GN  Outcome: Progressing  Flowsheets (Taken 02/10/2023 2000)  Absence of Physical Injury: Implement safety measures based on patient assessment

## 2023-02-11 NOTE — Progress Notes (Signed)
 ACUTE PHYSICAL THERAPY GOALS:   (Developed with and agreed upon by patient and/or caregiver.)  LTG:  (1.)Kristin [name] will move from supine to sit and sit to supine , scoot up and down, and roll side to side in bed with INDEPENDENT within 7 treatment day(s).    (2.)Kristin [name] will transfer from bed to chair and chair to bed with INDEPENDENT using the least restrictive device within 7 treatment day(s).    (3.)Kristin [name] will ambulate with INDEPENDENT for 300 feet with the least restrictive device within 7 treatment day(s).  (4.)Kristin [name] will tolerate at least 23 min of dynamic standing activity to assist standing ADLs with the least restrictive device within 7 treatment days.   (5.)Kristin [name] will climb at least 14 steps with MODIFIED INDEPENDENCE with the least restrictive device within 7 treatment days.    PHYSICAL THERAPY: Daily Note PM   (Link to Caseload Tracking: PT Visit Days : 3  Time In/Out PT Charge Capture  Rehab Caseload Tracker  Orders    Kristin [name] is a 63 y.o. female   PRIMARY DIAGNOSIS: S/P cervical spinal fusion  Cervical myelopathy (HCC) [G95.9]  S/P cervical spinal fusion [Z98.1]  Procedure(s) (LRB):  ERAS C3-C7 laminectomy and C3-C7 posterior fusion with allograft and instrumentation (N/A)  2 Days Post-Op  Inpatient: Payor: [employer] MEDICARE / Plan: [employer] GOLD PLUS HMO / Product Type: *No Product type* /     ASSESSMENT:     REHAB RECOMMENDATIONS:   Recommendation to date pending progress:  Setting:  Home Health Therapy    Equipment:    None     ASSESSMENT:  Kristin [name] presents in supine, having just received pain meds, agreeable to session. Claming breathing techniques are taught to pt, but she is unable to tolerate OOB activity secondary to pain and reports of anxiety. She performs log roll w/ min A to EOB, then CGA w/ RW from bed to bedside commode and back to bed. No progress this afternoon.      SUBJECTIVE:   Kristin [name] states,  You got to get me back to bed!       Social/Functional Home Equipment: None  Receives Help From: Family  Prior Level of Assist for ADLs: Independent  Prior Level of Assist for Homemaking: Independent  Prior Level of Assist for Transfers: Independent  Active Driver: Yes  Occupation: On disability  OBJECTIVE:     PAIN: VITALS / O2: PRECAUTION / LINES / DRAINS:   Pre Treatment:    8/10      Post Treatment: 8/10 Vitals        Oxygen    None    RESTRICTIONS/PRECAUTIONS:        MOBILITY: I Mod I S SBA CGA Min Mod Max Total  NT x2 Comments:   Bed Mobility    Rolling []  []  []  []  [x]  []  []  []  []  []  []     Supine to Sit []  []  []  []  [x]  []  []  []  []  []  []     Scooting []  []  []  []  [x]  []  []  []  []  []  []     Sit to Supine []  []  []  []  [x]  []  []  []  []  []  []     Transfers    Sit to Stand []  []  []  []  [x]  []  []  []  []  []  []     Bed to Chair []  []  []  []  [x]  []  []  []  []  []  []  Bedside commode   Stand to Sit []  []  []  []  [x]  []  []  []  []  []  []      []  []  []  []  []  []  []  []  []  []  []   I=Independent, Mod I=Modified Independent, S=Supervision, SBA=Standby Assistance, CGA=Contact Guard Assistance,   Min=Minimal Assistance, Mod=Moderate Assistance, Max=Maximal Assistance, Total=Total Assistance, NT=Not Tested    BALANCE: Good Fair+ Fair Fair- Poor NT Comments   Sitting Static []  [x]  []  []  []  []     Sitting Dynamic []  [x]  []  []  []  []               Standing Static []  [x]  []  []  []  []     Standing Dynamic []  []  [x]  []  []  []       GAIT: I Mod I S SBA CGA Min Mod Max Total  NT x2 Comments:   Level of Assistance []  []  []  []  [x]  []  []  []  []   []  Bed to bedside commode    Distance 2 x 5  feet    DME Rolling Walker    Gait Quality Decreased cadence , Decreased step clearance, and Decreased step length    Weightbearing Status      Stairs      I=Independent, Mod I=Modified Independent, S=Supervision, SBA=Standby Assistance, CGA=Contact Guard Assistance,   Min=Minimal Assistance, Mod=Moderate Assistance, Max=Maximal Assistance, Total=Total Assistance, NT=Not Tested    PLAN:   FREQUENCY AND DURATION: BID  for duration of hospital stay or until stated goals are met, whichever comes first.    TREATMENT:   TREATMENT:   Therapeutic Activity (17 Minutes): Therapeutic activity included Rolling, Supine to Sit, Sit to Supine, Scooting, Ambulation on level ground, Sitting balance , and Standing balance to improve functional Activity tolerance, Balance, Coordination, Mobility, and Strength.    TREATMENT GRID:  N/A    AFTER TREATMENT PRECAUTIONS: Alarm Activated, Bed, Bed/Chair Locked, Call light within reach, Needs within reach, RN notified, and Visitors at bedside    INTERDISCIPLINARY COLLABORATION:  RN/ PCT and PT/ PTA    EDUCATION: Education Given To: Patient;Family  Education Provided: Role of Therapy;Plan of Care    TIME IN/OUT:  Time In: 1313  Time Out: 1330  Minutes: 17    Camie FORBES Ko, PT

## 2023-02-12 MED ORDER — HYDROMORPHONE HCL PF 1 MG/ML IJ SOLN
1 | INTRAMUSCULAR | Status: DC | PRN
Start: 2023-02-12 — End: 2023-02-16

## 2023-02-12 MED ORDER — HYDROMORPHONE HCL PF 1 MG/ML IJ SOLN
1 | INTRAMUSCULAR | Status: DC | PRN
Start: 2023-02-12 — End: 2023-02-12

## 2023-02-12 MED ORDER — HYDROMORPHONE HCL PF 1 MG/ML IJ SOLN
1 | INTRAMUSCULAR | Status: DC | PRN
Start: 2023-02-12 — End: 2023-02-12
  Administered 2023-02-12 (×3): 1 mg via INTRAVENOUS

## 2023-02-12 MED ORDER — HYDROCODONE-ACETAMINOPHEN 10-325 MG PO TABS
10-325 | ORAL | Status: DC | PRN
Start: 2023-02-12 — End: 2023-02-13
  Administered 2023-02-12 – 2023-02-13 (×4): 1 via ORAL

## 2023-02-12 MED ORDER — CYCLOBENZAPRINE HCL 10 MG PO TABS
10 | Freq: Three times a day (TID) | ORAL | Status: DC | PRN
Start: 2023-02-12 — End: 2023-02-12
  Administered 2023-02-12: 17:00:00 10 mg via ORAL

## 2023-02-12 MED ORDER — DEXAMETHASONE SODIUM PHOSPHATE 4 MG/ML IJ SOLN
4 | Freq: Three times a day (TID) | INTRAMUSCULAR | Status: AC
Start: 2023-02-12 — End: 2023-02-13
  Administered 2023-02-12 – 2023-02-13 (×3): 8 mg via INTRAVENOUS

## 2023-02-12 MED ORDER — HYDROMORPHONE HCL PF 1 MG/ML IJ SOLN
1 | INTRAMUSCULAR | Status: DC | PRN
Start: 2023-02-12 — End: 2023-02-16
  Administered 2023-02-13: 02:00:00 1 mg via INTRAVENOUS

## 2023-02-12 MED ORDER — DIAZEPAM 2 MG PO TABS
2 | Freq: Four times a day (QID) | ORAL | Status: DC | PRN
Start: 2023-02-12 — End: 2023-02-16
  Administered 2023-02-12 – 2023-02-15 (×5): 5 mg via ORAL

## 2023-02-12 MED FILL — HYDROMORPHONE HCL 1 MG/ML IJ SOLN: 1 MG/ML | INTRAMUSCULAR | Qty: 1

## 2023-02-12 MED FILL — HYDROCODONE-ACETAMINOPHEN 10-325 MG PO TABS: 10-325 MG | ORAL | Qty: 1

## 2023-02-12 MED FILL — ALPRAZOLAM 0.25 MG PO TABS: 0.25 MG | ORAL | Qty: 4

## 2023-02-12 MED FILL — FAMOTIDINE 20 MG PO TABS: 20 MG | ORAL | Qty: 1

## 2023-02-12 MED FILL — ACETAMINOPHEN 325 MG PO TABS: 325 MG | ORAL | Qty: 2

## 2023-02-12 MED FILL — SENNOSIDES-DOCUSATE SODIUM 8.6-50 MG PO TABS: 8.6-50 MG | ORAL | Qty: 1

## 2023-02-12 MED FILL — OXYCODONE HCL 5 MG PO TABS: 5 MG | ORAL | Qty: 2

## 2023-02-12 MED FILL — CYCLOBENZAPRINE HCL 10 MG PO TABS: 10 MG | ORAL | Qty: 1

## 2023-02-12 MED FILL — DEXAMETHASONE SODIUM PHOSPHATE 4 MG/ML IJ SOLN: 4 MG/ML | INTRAMUSCULAR | Qty: 2

## 2023-02-12 MED FILL — DIPHENHYDRAMINE HCL 25 MG PO CAPS: 25 MG | ORAL | Qty: 1

## 2023-02-12 MED FILL — BISACODYL EC 5 MG PO TBEC: 5 MG | ORAL | Qty: 1

## 2023-02-12 MED FILL — DIAZEPAM 2 MG PO TABS: 2 MG | ORAL | Qty: 3

## 2023-02-12 MED FILL — METHOCARBAMOL 750 MG PO TABS: 750 MG | ORAL | Qty: 1

## 2023-02-12 NOTE — Progress Notes (Addendum)
 ACUTE OCCUPATIONAL THERAPY GOALS:   (Developed with and agreed upon by patient and/or caregiver.)  1. Kristin [name] will be modified independent with functional mobility for ADL in room within 4 - 7 visits.  2. Kristin [name] will be modified independent with total body bathing and dressing within 4 - 7 visits.  3. Kristin [name] will state and demonstrate at least 3 adaptive strategies/techniques during ADL/therapeutic activities within 4 - 7 visits.  4. Kristin [name] will voice a plan for 2-3 appropriate home modifications for home safety and fall prevention within 7 visits.  5. Kristin [name] will participate in at least 30 minutes of ADL with 3 or less rest breaks within 7 visits.  6. Kristin [name] will complete a tub or shower transfer with modified independence within 7 visits.    OCCUPATIONAL THERAPY: Daily Note AM   OT Visit Days: 2   Time In/Out  OT Charge Capture  Rehab Caseload Tracker  OT Orders    Kristin [name] is a 63 y.o. female   PRIMARY DIAGNOSIS: S/P cervical spinal fusion  Cervical myelopathy (HCC) [G95.9]  S/P cervical spinal fusion [Z98.1]  Procedure(s) (LRB):  ERAS C3-C7 laminectomy and C3-C7 posterior fusion with allograft and instrumentation (N/A)  3 Days Post-Op  Inpatient: Payor: [employer] MEDICARE / Plan: [employer] GOLD PLUS HMO / Product Type: *No Product type* /     ASSESSMENT:     REHAB RECOMMENDATIONS:   Recommendation to date pending progress:  Setting:  Home Health Therapy    Equipment:    None     ASSESSMENT:  Kristin [name] upon arrival in sidelying, patient crying in pain upon arrival requesting to go the bathroom. She needed mod assist for supine to sit with log roll method, max cues for initiation and safety. Once edge of bed she stood with contact guard assist- deferred using rolling walker due to urgency to get to the bathroom. She performed functional mobility with hand held assist to bathroom with contact guard assist. Patient  completed toileting with mod assist for clothing management- patient encouraged to participate in her own hygiene which she completed in sitting. Patient then performed functional mobility to chair with contact guard assist. For duration of session patient crying/yelling out in pain, RN present for duration of session. Patient was unable to get comfortable in recliner after trying multiple positions, she then completed stand pivot to edge of bed with contact guard assist. She needed min assist for sit to supine with log roll method. Patient very limited due to pain, has received pain medication prior to session, positioned patient in bed for comfort. Patient will continue to benefit from skilled OT services to maximize independence and safety with ADL tasks.          SUBJECTIVE:     Kristin [name] states, I am hurting so bad, I don't mean to be ugly.     Social/Functional Home Equipment: None  Receives Help From: Family  Prior Level of Assist for ADLs: Independent  Prior Level of Assist for Homemaking: Independent  Prior Level of Assist for Transfers: Independent  Active Driver: Yes  Occupation: On disability    OBJECTIVE:     LINES / DRAINS / AIRWAY: None    RESTRICTIONS/PRECAUTIONS:  Restrictions/Precautions  Required Braces or Orthoses?: Yes  Position Activity Restriction  Spinal Precautions: No Bending, No Lifting, No Twisting  Required Braces or Orthoses  Cervical: soft        PAIN: VITALS /  O2:   Pre Treatment: 10/10 pain in neck            Post Treatment: same, RN present and aware Vitals          Oxygen   Room air      MOBILITY: I Mod I S SBA CGA Min Mod Max Total  NT x2 Comments:   Bed Mobility    Rolling []  []  []  []  []  []  []  []  []  []  []     Supine to Sit []  []  []  []  []  []  [x]  []  []  []  []  Log roll    Scooting []  []  []  []  []  [x]  []  []  []  []  []     Sit to Supine []  []  []  []  []  [x]  []  []  []  []  []  Log roll    Transfers    Sit to Stand []  []  []  []  [x]  []  []  []  []  []  []  Hand held assist    Bed to Chair []  []  []  []   [x]  []  []  []  []  []  []     Stand to Sit []  []  []  []  [x]  []  []  []  []  []  []     Tub/Shower []  []  []  []  []  []  []  []  []  []  []      Toilet []  []  []  []  [x]  []  []  []  []  []  []       []  []  []  []  []  []  []  []  []  []  []     I=Independent, Mod I=Modified Independent, S=Supervision/Setup, SBA=Standby Assistance, CGA=Contact Guard Assistance, Min=Minimal Assistance, Mod=Moderate Assistance, Max=Maximal Assistance, Total=Total Assistance, NT=Not Tested    ACTIVITIES OF DAILY LIVING: I Mod I S SBA CGA Min Mod Max Total NT Comments   BASIC ADLs:              Upper Body   Bathing []  []  []  []  []  []  []  []  []  []      Lower Body Bathing []  []  []  []  []  []  []  []  []  []      Toileting []  []  []  []  []  []  [x]  []  []  []  Assistance with clothing- able to complete hygiene with encouragement    Upper Body Dressing []  []  []  []  []  []  []  []  []  []     Lower Body Dressing []  []  []  []  []  []  []  []  []  []     Feeding []  []  []  []  []  []  []  []  []  []     Grooming []  []  []  []  []  []  []  []  []  []     Personal Device Care []  []  []  []  []  []  []  []  []  []     Functional Mobility []  []  []  []  [x]  []  []  []  []  []  Hand held assist    I=Independent, Mod I=Modified Independent, S=Supervision/Setup, SBA=Standby Assistance, CGA=Contact Guard Assistance, Min=Minimal Assistance, Mod=Moderate Assistance, Max=Maximal Assistance, Total=Total Assistance, NT=Not Tested    BALANCE: Good Fair+ Fair Fair- Poor NT Comments   Sitting Static []  [x]  []  []  []  []     Sitting Dynamic []  [x]  []  []  []  []               Standing Static []  []  [x]  []  []  []     Standing Dynamic []  []  []  [x]  []  []         PLAN:     FREQUENCY/DURATION   OT Plan of Care: 3 times/week for duration of hospital stay or until stated goals are met, whichever comes first.    TREATMENT:     TREATMENT:   Therapeutic Activity (15 Minutes): Patient participated in therapeutic activities including bed mobility training, functional transfer training, toilet transfer  training, and bathroom mobility with maximal verbal cues, tactile cues, and visual cues  in order to increase independence, increase activity tolerance, and prepare for discharge home.   Self Care (10 minutes): Patient participated in toileting, functional mobility, bed mobility, functional transfer, and bathroom mobility in supported sitting and standing with moderate visual, verbal, and tactile cueing to increase independence and increase activity tolerance. The patient was educated on role of occupational therapy, strategies to improve safety, and transfer training and safety and patient verbalized understanding and will need reinforcement at next session.     TREATMENT GRID:  N/A    AFTER TREATMENT PRECAUTIONS: Bed, Bed/Chair Locked, Call light within reach, Needs within reach, RN at bedside, and Visitors at bedside    INTERDISCIPLINARY COLLABORATION:  RN/ PCT and PT/ PTA    EDUCATION:  Education Given To: Patient  Education Provided: Role of Therapy;Plan of Care;Transfer Training;Mobility Training;Precautions  Education Method: Demonstration;Verbal  Education Outcome: Verbalized understanding;Continued education needed    TOTAL TREATMENT DURATION AND TIME:  Time In: 1030  Time Out: 1055  Minutes: 25    Johnnie Coss, OT

## 2023-02-12 NOTE — Care Coordination-Inpatient (Signed)
 Chart review complete.  CM met with pt/dtr at the bedside to follow up on the DME order.  Per dtr, [employer] delivered a rollator to the room today. CM following.

## 2023-02-12 NOTE — Progress Notes (Signed)
 [facility] End of Shift-Days For Spine Surgery Patient    3 Days Post-Op   Ideal body weight: 47.8 kg (105 lb 6.1 oz)    ADULT DIET; Regular  ADULT ORAL NUTRITION SUPPLEMENT; PM Snack, AM Snack; Other Oral Supplement; Ensure Surgery BID x 5 days    Tolerating Diet?: Yes    Ensure Surgery Immunonutrition Shakes - 2 per day (Starting POD 0)?  No, patient refused    Ambulated in hall 0 times.     Refused sitting up in chair for meals due to pain, was able to sit in chair briefly twice during the shift.    PRN Pain Medications Used?: Yes    IS used with RN prompting    BM?  No   Passing flatus? Yes    TEDs No    SCDs Refused         Signed By: Johnnathan Hagemeister, RN     February 12, 2023

## 2023-02-12 NOTE — Progress Notes (Signed)
 Physical Therapy Note:    Attempted to see patient this AM for physical therapy treatment  session. Patient attempted twice this AM, first time medicated strongly and sleeping, 2nd attempt screaming in pain and back in the bed from the bathroom. Will follow and re-attempt as schedule permits/patient available. Thank you,    Chiquita CHRISTELLA Rase, PTA     Rehab Caseload Tracker

## 2023-02-12 NOTE — Progress Notes (Signed)
 ACUTE PHYSICAL THERAPY GOALS:   (Developed with and agreed upon by patient and/or caregiver.)  LTG:  (1.)Kristin [name] will move from supine to sit and sit to supine , scoot up and down, and roll side to side in bed with INDEPENDENT within 7 treatment day(s).    (2.)Kristin [name] will transfer from bed to chair and chair to bed with INDEPENDENT using the least restrictive device within 7 treatment day(s).    (3.)Kristin [name] will ambulate with INDEPENDENT for 300 feet with the least restrictive device within 7 treatment day(s).  (4.)Kristin [name] will tolerate at least 23 min of dynamic standing activity to assist standing ADLs with the least restrictive device within 7 treatment days.   (5.)Kristin [name] will climb at least 14 steps with MODIFIED INDEPENDENCE with the least restrictive device within 7 treatment days.    PHYSICAL THERAPY: Daily Note PM   (Link to Caseload Tracking: PT Visit Days : 4  Time In/Out PT Charge Capture  Rehab Caseload Tracker  Orders    Kristin [name] is a 63 y.o. female   PRIMARY DIAGNOSIS: S/P cervical spinal fusion  Cervical myelopathy (HCC) [G95.9]  S/P cervical spinal fusion [Z98.1]  Procedure(s) (LRB):  ERAS C3-C7 laminectomy and C3-C7 posterior fusion with allograft and instrumentation (N/A)  3 Days Post-Op  Inpatient: Payor: [employer] MEDICARE / Plan: [employer] GOLD PLUS HMO / Product Type: *No Product type* /     ASSESSMENT:     REHAB RECOMMENDATIONS:   Recommendation to date pending progress:  Setting:  Home Health Therapy    Equipment:    None     ASSESSMENT:  Kristin [name] is supine upon contact, laying on her back while RN attempted to get a BP reading.  The patient was gasping in pain and squirming her legs and arms continuously.  Attempted patient calming techniques including breathing, gentle touch, and soft speaking with minimal success.  The patient was then assisted with log rolling onto her side to alleviate pressure on her neck.  She required CGA/min A to roll.  Once  positioned in side lying she quickly calmed down, even falling asleep.  Rn at bedside throughout, able to get a BP at the end of session, 109/60.  No progress this session due to continued high levels of pain.     SUBJECTIVE:   Kristin [name] states,  Why did that help?     Social/Functional Home Equipment: None  Receives Help From: Family  Prior Level of Assist for ADLs: Independent  Prior Level of Assist for Homemaking: Independent  Prior Level of Assist for Transfers: Independent  Active Driver: Yes  Occupation: On disability  OBJECTIVE:     PAIN: VITALS / O2: PRECAUTION / LINES / DRAINS:   Pre Treatment:    10/10      Post Treatment: not rated, sleeping Vitals        Oxygen    None    RESTRICTIONS/PRECAUTIONS:        MOBILITY: I Mod I S SBA CGA Min Mod Max Total  NT x2 Comments:   Bed Mobility    Rolling []  []  []  []  [x]  [x]  []  []  []  []  []     Supine to Sit []  []  []  []  []  []  []  []  []  [x]  []  Unable to attempt due to pain   Scooting []  []  []  []  []  []  []  []  []  [x]  []     Sit to Supine []  []  []  []  []  []  []  []  []  [x]  []   Transfers    Sit to Stand []  []  []  []  []  []  []  []  []  [x]  []     Bed to Chair []  []  []  []  []  []  []  []  []  [x]  []     Stand to Sit []  []  []  []  []  []  []  []  []  [x]  []      []  []  []  []  []  []  []  []  []  []  []     I=Independent, Mod I=Modified Independent, S=Supervision, SBA=Standby Assistance, CGA=Contact Guard Assistance,   Min=Minimal Assistance, Mod=Moderate Assistance, Max=Maximal Assistance, Total=Total Assistance, NT=Not Tested    BALANCE: Good Fair+ Fair Fair- Poor NT Comments   Sitting Static []  [x]  []  []  []  []     Sitting Dynamic []  [x]  []  []  []  []               Standing Static []  [x]  []  []  []  []     Standing Dynamic []  []  [x]  []  []  []       GAIT: I Mod I S SBA CGA Min Mod Max Total  NT x2 Comments:   Level of Assistance []  []  []  []  []  []  []  []  []  x []     Distance   feet    DME Rolling Walker    Gait Quality Decreased cadence , Decreased step clearance, and Decreased step length    Weightbearing Status       Stairs      I=Independent, Mod I=Modified Independent, S=Supervision, SBA=Standby Assistance, CGA=Contact Guard Assistance,   Min=Minimal Assistance, Mod=Moderate Assistance, Max=Maximal Assistance, Total=Total Assistance, NT=Not Tested    PLAN:   FREQUENCY AND DURATION: BID for duration of hospital stay or until stated goals are met, whichever comes first.    TREATMENT:   TREATMENT:   Therapeutic Activity (18 Minutes): Therapeutic activity included Rolling to improve functional Activity tolerance and Mobility.    TREATMENT GRID:  N/A    AFTER TREATMENT PRECAUTIONS: Alarm Activated, Bed, Bed/Chair Locked, Call light within reach, Needs within reach, RN notified, and Visitors at bedside    INTERDISCIPLINARY COLLABORATION:  RN/ PCT and PT/ PTA    EDUCATION:      TIME IN/OUT:  Time In: 1513  Time Out: 1531  Minutes: [address]    Chiquita CHRISTELLA Rase, PTA

## 2023-02-12 NOTE — Progress Notes (Signed)
 ORTHO PROGRESS NOTE    February 12, 2023    Admit Date: 02/09/2023  Post Op day: 3 Days Post-Op      Subjective:     Kristin [name] is a patient who is now 3 Days Post-Op  and has complaints neck pain . No SOB or chest pain       Objective:     PT/OT:    Progressing slowly    Vital Signs:    Patient Vitals for the past 8 hrs:   BP Temp Temp src Pulse Resp SpO2   02/12/23 1136 -- -- -- 88 -- 100 %   02/12/23 1131 125/66 98.6 F (37 C) -- 87 16 (!) 88 %   02/12/23 1056 -- -- -- -- 14 --   02/12/23 1055 127/89 -- -- 84 -- --   02/12/23 0851 -- -- -- -- 14 --   02/12/23 0846 126/71 -- -- 85 -- --   02/12/23 0745 -- -- -- -- 20 --   02/12/23 0713 114/64 99.1 F (37.3 C) Oral 76 16 91 %     Temp (24hrs), Avg:98.6 F (37 C), Min:98 F (36.7 C), Max:99.1 F (37.3 C)      LAB:    No results for input(s): HGB, WBC, PLT in the last 72 hours.    Physical Exam:    Awake and in no acute distress.  Mood and affect appropriate.  Respirations unlabored and no evidence cyanosis.  Calves nontender.  Abdomen soft and nontender.  Dressing clean/dry  No new neurologic deficit.    Assessment:      3 Days Post-Op STATUS POST Procedure(s):  ERAS C3-C7 laminectomy and C3-C7 posterior fusion with allograft and instrumentation      Plan:     -PT/OT  -pain control  -aggressive pulmonary toilet    Anticipate discharge to: HOME tomorrow      Signed By: Lynwood Addie Maier, MD

## 2023-02-12 NOTE — Progress Notes (Signed)
 On-call ortho provider notified about ongoing pain concerns. Pt receiving pain meds frequently and nothing seems to help ease her pain.      See new orders.

## 2023-02-13 MED ORDER — OXYCODONE HCL 5 MG PO TABS
5 | ORAL | Status: DC | PRN
Start: 2023-02-13 — End: 2023-02-16
  Administered 2023-02-13 – 2023-02-16 (×7): 10 mg via ORAL

## 2023-02-13 MED ORDER — OXYCODONE HCL 5 MG PO TABS
5 | ORAL | Status: DC | PRN
Start: 2023-02-13 — End: 2023-02-16
  Administered 2023-02-14 – 2023-02-15 (×2): 5 mg via ORAL

## 2023-02-13 MED ORDER — KETOROLAC TROMETHAMINE 15 MG/ML IJ SOLN
15 | Freq: Four times a day (QID) | INTRAMUSCULAR | Status: AC
Start: 2023-02-13 — End: 2023-02-14
  Administered 2023-02-13 – 2023-02-14 (×4): 15 mg via INTRAVENOUS

## 2023-02-13 MED FILL — SENNOSIDES-DOCUSATE SODIUM 8.6-50 MG PO TABS: 8.6-50 MG | ORAL | Qty: 1

## 2023-02-13 MED FILL — OXYCODONE HCL 5 MG PO TABS: 5 MG | ORAL | Qty: 2

## 2023-02-13 MED FILL — FAMOTIDINE 20 MG PO TABS: 20 MG | ORAL | Qty: 1

## 2023-02-13 MED FILL — DEXAMETHASONE SODIUM PHOSPHATE 4 MG/ML IJ SOLN: 4 MG/ML | INTRAMUSCULAR | Qty: 2

## 2023-02-13 MED FILL — HYDROCODONE-ACETAMINOPHEN 10-325 MG PO TABS: 10-325 MG | ORAL | Qty: 1

## 2023-02-13 MED FILL — KETOROLAC TROMETHAMINE 15 MG/ML IJ SOLN: 15 MG/ML | INTRAMUSCULAR | Qty: 1

## 2023-02-13 MED FILL — ACETAMINOPHEN 325 MG PO TABS: 325 MG | ORAL | Qty: 2

## 2023-02-13 MED FILL — HYDROMORPHONE HCL 1 MG/ML IJ SOLN: 1 MG/ML | INTRAMUSCULAR | Qty: 1

## 2023-02-13 MED FILL — DIAZEPAM 2 MG PO TABS: 2 MG | ORAL | Qty: 3

## 2023-02-13 MED FILL — BISACODYL EC 5 MG PO TBEC: 5 MG | ORAL | Qty: 1

## 2023-02-13 MED FILL — FAMOTIDINE (PF) 20 MG/2ML IV SOLN: 20 MG/2ML | INTRAVENOUS | Qty: 2

## 2023-02-13 NOTE — Progress Notes (Signed)
 ACUTE PHYSICAL THERAPY GOALS:   (Developed with and agreed upon by patient and/or caregiver.)  LTG:  (1.)Kristin [name] will move from supine to sit and sit to supine , scoot up and down, and roll side to side in bed with INDEPENDENT within 7 treatment day(s).    (2.)Kristin [name] will transfer from bed to chair and chair to bed with INDEPENDENT using the least restrictive device within 7 treatment day(s).    (3.)Kristin [name] will ambulate with INDEPENDENT for 300 feet with the least restrictive device within 7 treatment day(s).  (4.)Kristin [name] will tolerate at least 23 min of dynamic standing activity to assist standing ADLs with the least restrictive device within 7 treatment days.   (5.)Kristin [name] will climb at least 14 steps with MODIFIED INDEPENDENCE with the least restrictive device within 7 treatment days.    PHYSICAL THERAPY: Daily Note AM   (Link to Caseload Tracking: PT Visit Days : 5  Time In/Out PT Charge Capture  Rehab Caseload Tracker  Orders    Kristin [name] is a 63 y.o. female   PRIMARY DIAGNOSIS: S/P cervical spinal fusion  Cervical myelopathy (HCC) [G95.9]  S/P cervical spinal fusion [Z98.1]  Procedure(s) (LRB):  ERAS C3-C7 laminectomy and C3-C7 posterior fusion with allograft and instrumentation (N/A)  4 Days Post-Op  Inpatient: Payor: [employer] MEDICARE / Plan: [employer] GOLD PLUS HMO / Product Type: *No Product type* /     ASSESSMENT:     REHAB RECOMMENDATIONS:   Recommendation to date pending progress:  Setting:  Home Health Therapy    Equipment:    None     ASSESSMENT:  Kristin [name] continues to be limited by high levels of pain and anxiety.  The patient was supine, side lying upon contact and agreeable to attempting mobility.  She performed bed mobility with mod A and cueing for sequencing.  The patient was able to ambulate 15' with the RW and min A/CGA.  She needed a close chair follow for safety and did take one seated rest.  The patient was crying throughout treatment due to the pain and  required constant cueing and calming techniques for breathing and relaxation.  The patient returned to supine, side lying at the end of treatment with min A.  Progress remains minimal due to uncontrolled pain.      SUBJECTIVE:   Kristin [name] states, I know I have to try     Social/Functional Home Equipment: None  Receives Help From: Family  Prior Level of Assist for ADLs: Independent  Prior Level of Assist for Homemaking: Independent  Prior Level of Assist for Transfers: Independent  Active Driver: Yes  Occupation: On disability  OBJECTIVE:     PAIN: VITALS / O2: PRECAUTION / LINES / DRAINS:   Pre Treatment:    4 in side lying  10 during mobility    Post Treatment: unchanged Vitals        Oxygen    None    RESTRICTIONS/PRECAUTIONS:        MOBILITY: I Mod I S SBA CGA Min Mod Max Total  NT x2 Comments:   Bed Mobility    Rolling []  []  []  []  [x]  [x]  []  []  []  []  []     Supine to Sit []  []  []  []  []  [x]  [x]  []  []  []  [x]     Scooting []  []  []  []  [x]  [x]  []  []  []  []  [x]     Sit to Supine []  []  []  []  []  [x]  []  []  []  []  []   Transfers    Sit to Stand []  []  []  []  [x]  []  []  []  []  []  [x]     Bed to Chair []  []  []  []  [x]  []  []  []  []  []  [x]     Stand to Sit []  []  []  []  [x]  []  []  []  []  []  [x]      []  []  []  []  []  []  []  []  []  []  []     I=Independent, Mod I=Modified Independent, S=Supervision, SBA=Standby Assistance, CGA=Contact Guard Assistance,   Min=Minimal Assistance, Mod=Moderate Assistance, Max=Maximal Assistance, Total=Total Assistance, NT=Not Tested    BALANCE: Good Fair+ Fair Fair- Poor NT Comments   Sitting Static []  [x]  []  []  []  []     Sitting Dynamic []  [x]  []  []  []  []               Standing Static []  [x]  []  []  []  []     Standing Dynamic []  []  [x]  []  []  []       GAIT: I Mod I S SBA CGA Min Mod Max Total  NT x2 Comments:   Level of Assistance []  []  []  []  [x]  [x]  []  []  []   [x]     Distance 15  feet    DME Rolling Walker    Gait Quality Decreased cadence , Decreased step clearance, and Decreased step length    Weightbearing Status       Stairs      I=Independent, Mod I=Modified Independent, S=Supervision, SBA=Standby Assistance, CGA=Contact Guard Assistance,   Min=Minimal Assistance, Mod=Moderate Assistance, Max=Maximal Assistance, Total=Total Assistance, NT=Not Tested    PLAN:   FREQUENCY AND DURATION: BID for duration of hospital stay or until stated goals are met, whichever comes first.    TREATMENT:   TREATMENT:   Co-Treatment PT/OT necessary due to patient's decreased overall endurance/tolerance levels, as well as need for high level skilled assistance to complete functional transfers/mobility and functional tasks  Therapeutic Activity (28 Minutes): Therapeutic activity included Rolling, Supine to Sit, Sit to Supine, Scooting, Transfer Training, Ambulation on level ground, Sitting balance , and Standing balance to improve functional Activity tolerance, Balance, Coordination, Mobility, Strength, and ROM.    TREATMENT GRID:  N/A    AFTER TREATMENT PRECAUTIONS: Alarm Activated, Bed, Bed/Chair Locked, Call light within reach, Needs within reach, RN notified, and Visitors at bedside    INTERDISCIPLINARY COLLABORATION:  RN/ PCT, PT/ PTA, and OT/ COTA    EDUCATION:      TIME IN/OUT:  Time In: 1002  Time Out: 1030  Minutes: [address]    Chiquita CHRISTELLA Rase, PTA

## 2023-02-13 NOTE — Progress Notes (Signed)
 ORTHOPAEDIC PROGRESS NOTE    February 13, 2023  Admit Date: 02/09/2023  Admit Diagnosis: Cervical myelopathy (HCC) [G95.9]  S/P cervical spinal fusion [Z98.1]  Procedure: Procedure(s):  ERAS C3-C7 laminectomy and C3-C7 posterior fusion with allograft and instrumentation  Post Op day: 4 Days Post-Op      Subjective:     Kristin [name] is a patient who has complaints of pain.  Patient had panic attack yesterday related to pain.  She was changed to hydrocodone  does not seem to be doing as well with this.  She does have as needed Valium  ordered.  Reassured patient that the surgery is painful.  No evidence of any kind of fluid collection or hematoma.  She is utilizing the collar when she is up and out of bed.  She does tolerate NSAIDs. .       Objective:     Vital Signs:    Blood pressure 138/87, pulse 76, temperature 98.2 F (36.8 C), temperature source Oral, resp. rate 16, height 1.549 m (5' 1), weight 103.9 kg (229 lb), SpO2 96%.    Temp (24hrs), Avg:98.2 F (36.8 C), Min:97.9 F (36.6 C), Max:98.6 F (37 C)      No intake/output data recorded.  No intake/output data recorded.    LAB:    No results for input(s): HGB, WBC, PLT in the last 72 hours.    Physical Exam    General:   Alert and oriented. No acute distress  Lungs:  Respirations unlabored.  Extremities: No evidence of cyanosis. Calves soft, nontender.    Moves both upper and lower extremities.   Dressing:  clean, dry, and intact  Neuro:  no deficit      Assessment:      Patient Active Problem List   Diagnosis    Cervical herniated disc    Acquired spondylolisthesis    Spinal stenosis, lumbar region, with neurogenic claudication    Anxiety    Other secondary hypertension    Cervical myelopathy (HCC)    S/P cervical spinal fusion       Plan:     Continue PT/OT  I will go ahead and adjust her pain medication.  Will discontinue the hydrocodone .  She is on continued oral Tylenol .  I will put her back on Roxicodone  and see if this is more beneficial.   I did encourage her to take the Valium  in between to help with muscle spasm.  I will give her 4 doses of Toradol  as well.      Anticipate Discharge To: HOME when pain is controlled.     Signed By: Ami Skipper, APRN - CNP

## 2023-02-13 NOTE — Progress Notes (Signed)
 ACUTE OCCUPATIONAL THERAPY GOALS:   (Developed with and agreed upon by patient and/or caregiver.)  1. Kristin [name] will be modified independent with functional mobility for ADL in room within 4 - 7 visits.  2. Kristin [name] will be modified independent with total body bathing and dressing within 4 - 7 visits.  3. Kristin [name] will state and demonstrate at least 3 adaptive strategies/techniques during ADL/therapeutic activities within 4 - 7 visits.  4. Kristin [name] will voice a plan for 2-3 appropriate home modifications for home safety and fall prevention within 7 visits.  5. Kristin [name] will participate in at least 30 minutes of ADL with 3 or less rest breaks within 7 visits.  6. Kristin [name] will complete a tub or shower transfer with modified independence within 7 visits.    OCCUPATIONAL THERAPY: Daily Note AM   OT Visit Days: 3   Time In/Out  OT Charge Capture  Rehab Caseload Tracker  OT Orders    Kristin [name] is a 63 y.o. female   PRIMARY DIAGNOSIS: S/P cervical spinal fusion  Cervical myelopathy (HCC) [G95.9]  S/P cervical spinal fusion [Z98.1]  Procedure(s) (LRB):  ERAS C3-C7 laminectomy and C3-C7 posterior fusion with allograft and instrumentation (N/A)  4 Days Post-Op  Inpatient: Payor: [employer] MEDICARE / Plan: [employer] GOLD PLUS HMO / Product Type: *No Product type* /     ASSESSMENT:     REHAB RECOMMENDATIONS:   Recommendation to date pending progress:  Setting:  Home Health Therapy    Equipment:    None     ASSESSMENT:  Ms. Leuthold presents with decreased activity tolerance, decreased independence with mobility, decreased independence with self care, and UE weakness. Pt required overall CGA/min a with all mobility. Pt required max a to don socks, max a with combing hair, and min a to wash face and with teeth brushing. Pt continues to cry out in pain with mobility even though already medicated. Pt returned to supine post treatment. Continue OT  POC.       SUBJECTIVE:     Ms. Allender states, I know I need to walk.     Social/Functional Home Equipment: None  Receives Help From: Family  Prior Level of Assist for ADLs: Independent  Prior Level of Assist for Homemaking: Independent  Prior Level of Assist for Transfers: Independent  Active Driver: Yes  Occupation: On disability    OBJECTIVE:     LINES / DRAINS / AIRWAY: None    RESTRICTIONS/PRECAUTIONS:  Restrictions/Precautions  Required Braces or Orthoses?: Yes  Position Activity Restriction  Spinal Precautions: No Bending, No Lifting, No Twisting  Required Braces or Orthoses  Cervical: soft        PAIN: VITALS / O2:   Pre Treatment: 10/10 pain in neck            Post Treatment: same, RN notified Vitals          Oxygen   Room air      MOBILITY: I Mod I S SBA CGA Min Mod Max Total  NT x2 Comments:   Bed Mobility    Rolling []  []  []  []  [x]  [x]  []  []  []  []  []     Supine to Sit []  []  []  []  [x]  [x]  []  []  []  []  []  Log roll    Scooting []  []  []  []  [x]  [x]  []  []  []  []  []     Sit to Supine []  []  []  []  []  [x]  []  []  []  []  []   Log roll    Transfers    Sit to Stand []  []  []  []  [x]  []  []  []  []  []  []     Bed to Chair []  []  []  []  [x]  []  []  []  []  []  []     Stand to Sit []  []  []  []  [x]  []  []  []  []  []  []     Tub/Shower []  []  []  []  []  []  []  []  []  []  []      Toilet []  []  []  []  []  []  []  []  []  []  []       []  []  []  []  []  []  []  []  []  []  []     I=Independent, Mod I=Modified Independent, S=Supervision/Setup, SBA=Standby Assistance, CGA=Contact Guard Assistance, Min=Minimal Assistance, Mod=Moderate Assistance, Max=Maximal Assistance, Total=Total Assistance, NT=Not Tested    ACTIVITIES OF DAILY LIVING: I Mod I S SBA CGA Min Mod Max Total NT Comments   BASIC ADLs:              Upper Body   Bathing []  []  []  []  []  []  []  []  []  []      Lower Body Bathing []  []  []  []  []  []  []  []  []  []      Toileting []  []  []  []  []  []  []  []  []  []     Upper Body Dressing []  []  []  []  []  []  []  []  []  []     Lower Body Dressing []  []  []  []  []  []  []  [x]  []  []     Feeding []  []  []   []  []  []  []  []  []  []     Grooming []  []  []  []  []  [x]  []  [x]  []  []     Personal Device Care []  []  []  []  []  []  [x]  []  []  []     Functional Mobility []  []  []  []  [x]  [x]  []  []  []  []   With rolling walker    I=Independent, Mod I=Modified Independent, S=Supervision/Setup, SBA=Standby Assistance, CGA=Contact Guard Assistance, Min=Minimal Assistance, Mod=Moderate Assistance, Max=Maximal Assistance, Total=Total Assistance, NT=Not Tested    BALANCE: Good Fair+ Fair Fair- Poor NT Comments   Sitting Static []  [x]  []  []  []  []     Sitting Dynamic []  [x]  []  []  []  []               Standing Static []  []  [x]  []  []  []     Standing Dynamic []  []  []  [x]  []  []         PLAN:     FREQUENCY/DURATION   OT Plan of Care: 3 times/week for duration of hospital stay or until stated goals are met, whichever comes first.    TREATMENT:     TREATMENT:   Co-Treatment PT/OT necessary due to patient's decreased overall endurance/tolerance levels, as well as need for high level skilled assistance to complete functional transfers/mobility and functional tasks  Neuromuscular Re-education (15 Minutes): Patient participated in neuromuscular re-education including functional reaching, weight shifting, postural training, midline training, standing tolerance activity , and sitting balance activity   with minimal assistance, verbal cues, education, and adaptive equipment to improve sitting balance, standing balance, posture, coordination, static balance, and dynamic balance in order to prepare for functional task, prepare for seated ADLs, prepare for standing ADLs, prepare for functional transfer, increase safety awareness, and prepare for discharge home .   Self Care (14 minutes): Patient participated in lower body dressing, personal device care, grooming, functional mobility, bed mobility, functional transfer, and compensatory technique in supported sitting with moderate visual, verbal, manual, and tactile cueing to increase independence, increase activity tolerance,  and maintain precautions. The patient was educated on role of occupational therapy, strategies  to improve safety, and transfer training and safety and patient verbalized understanding and will need reinforcement at next session.     TREATMENT GRID:  N/A    AFTER TREATMENT PRECAUTIONS: Bed, Bed/Chair Locked, Call light within reach, Needs within reach, RN notified, Side rails x2, and Visitors at bedside    INTERDISCIPLINARY COLLABORATION:  RN/ PCT and PT/ PTA    EDUCATION:       TOTAL TREATMENT DURATION AND TIME:  Time In: 1002  Time Out: 1031  Minutes: 29    Mliss LITTIE Pouch, OTA

## 2023-02-13 NOTE — Progress Notes (Signed)
 ACUTE PHYSICAL THERAPY GOALS:   (Developed with and agreed upon by patient and/or caregiver.)  LTG:  (1.)Kristin [name] will move from supine to sit and sit to supine , scoot up and down, and roll side to side in bed with INDEPENDENT within 7 treatment day(s).    (2.)Kristin [name] will transfer from bed to chair and chair to bed with INDEPENDENT using the least restrictive device within 7 treatment day(s).    (3.)Kristin [name] will ambulate with INDEPENDENT for 300 feet with the least restrictive device within 7 treatment day(s).  (4.)Kristin [name] will tolerate at least 23 min of dynamic standing activity to assist standing ADLs with the least restrictive device within 7 treatment days.   (5.)Kristin [name] will climb at least 14 steps with MODIFIED INDEPENDENCE with the least restrictive device within 7 treatment days.    PHYSICAL THERAPY: Daily Note PM   (Link to Caseload Tracking: PT Visit Days : 5  Time In/Out PT Charge Capture  Rehab Caseload Tracker  Orders    Kristin [name] is a 63 y.o. female   PRIMARY DIAGNOSIS: S/P cervical spinal fusion  Cervical myelopathy (HCC) [G95.9]  S/P cervical spinal fusion [Z98.1]  Procedure(s) (LRB):  ERAS C3-C7 laminectomy and C3-C7 posterior fusion with allograft and instrumentation (N/A)  4 Days Post-Op  Inpatient: Payor: [employer] MEDICARE / Plan: [employer] GOLD PLUS HMO / Product Type: *No Product type* /     ASSESSMENT:     REHAB RECOMMENDATIONS:   Recommendation to date pending progress:  Setting:  Home Health Therapy    Equipment:    None     ASSESSMENT:  Kristin [name] continues to be limited by high levels of pain and anxiety.  The patient was supine, side lying upon contact and agreeable to attempting mobility.  She performed bed mobility with mod A and cueing for sequencing.  The patient was able to ambulate 15' with the RW and min A/CGA.  She needed a close chair follow for safety and did take one seated rest.  The patient was crying throughout treatment due to the pain and  required constant cueing and calming techniques for breathing and relaxation.  The patient returned to supine, side lying at the end of treatment with min A.  Progress remains minimal due to uncontrolled pain.     PM:  The patient demonstrated significantly improved pain management and was able to perform increased mobility.  She demonstrated good log rolling technique, transfers, and gait with CGA.  The patient did present with increased knee flexion in static standing, but this improved with cueing.  She was able to ambulate without gasping in pain, but requested to return to bed and defer further gait as she was afraid of the pain returning.  Slow progress this session.      SUBJECTIVE:   Kristin [name] states, Hold on, I'll pray first     Social/Functional Home Equipment: None  Receives Help From: Family  Prior Level of Assist for ADLs: Independent  Prior Level of Assist for [employer]: Independent  Prior Level of Assist for Transfers: Independent  Active Driver: Yes  Occupation: On disability  OBJECTIVE:     PAIN: VITALS / O2: PRECAUTION / LINES / DRAINS:   Pre Treatment:    4 in side lying      Post Treatment: unchanged Vitals        Oxygen    None    RESTRICTIONS/PRECAUTIONS:        MOBILITY: I Mod I  S SBA CGA Min Mod Max Total  NT x2 Comments:   Bed Mobility    Rolling []  []  []  []  [x]  []  []  []  []  []  []     Supine to Sit []  []  []  []  []  [x]  []  []  []  []  []     Scooting []  []  []  []  [x]  []  []  []  []  []  []     Sit to Supine []  []  []  []  [x]  []  []  []  []  []  []     Transfers    Sit to Stand []  []  []  []  [x]  []  []  []  []  []  []     Bed to Chair []  []  []  []  [x]  []  []  []  []  []  []     Stand to Sit []  []  []  []  [x]  []  []  []  []  []  []      []  []  []  []  []  []  []  []  []  []  []     I=Independent, Mod I=Modified Independent, S=Supervision, SBA=Standby Assistance, CGA=Contact Guard Assistance,   Min=Minimal Assistance, Mod=Moderate Assistance, Max=Maximal Assistance, Total=Total Assistance, NT=Not Tested    BALANCE: Good Fair+ Fair Fair- Poor NT  Comments   Sitting Static []  [x]  []  []  []  []     Sitting Dynamic []  [x]  []  []  []  []               Standing Static []  [x]  []  []  []  []     Standing Dynamic []  []  [x]  []  []  []       GAIT: I Mod I S SBA CGA Min Mod Max Total  NT x2 Comments:   Level of Assistance []  []  []  []  [x]  []  []  []  []   []     Distance 50  feet    DME Rolling Walker    Gait Quality Decreased cadence , Decreased step clearance, and Decreased step length    Weightbearing Status      Stairs      I=Independent, Mod I=Modified Independent, S=Supervision, SBA=Standby Assistance, CGA=Contact Guard Assistance,   Min=Minimal Assistance, Mod=Moderate Assistance, Max=Maximal Assistance, Total=Total Assistance, NT=Not Tested    PLAN:   FREQUENCY AND DURATION: BID for duration of hospital stay or until stated goals are met, whichever comes first.    TREATMENT:   TREATMENT:   Therapeutic Activity (11 Minutes): Therapeutic activity included Rolling, Supine to Sit, Sit to Supine, Scooting, Transfer Training, Ambulation on level ground, Sitting balance , and Standing balance to improve functional Activity tolerance, Balance, Coordination, Mobility, Strength, and ROM.    TREATMENT GRID:  N/A    AFTER TREATMENT PRECAUTIONS: Alarm Activated, Bed, Bed/Chair Locked, Call light within reach, Needs within reach, and RN notified    INTERDISCIPLINARY COLLABORATION:  RN/ PCT, PT/ PTA, and Rehab Attendant    EDUCATION:      TIME IN/OUT:  Time In: 1327  Time Out: 1338  Minutes: 11    Chiquita CHRISTELLA Rase, PTA

## 2023-02-14 MED ORDER — TIZANIDINE HCL 2 MG PO TABS
2 | Freq: Four times a day (QID) | ORAL | Status: DC | PRN
Start: 2023-02-14 — End: 2023-02-16
  Administered 2023-02-14 – 2023-02-16 (×7): 2 mg via ORAL

## 2023-02-14 MED ORDER — IBUPROFEN 800 MG PO TABS
800 | Freq: Three times a day (TID) | ORAL | Status: DC
Start: 2023-02-14 — End: 2023-02-16
  Administered 2023-02-14 – 2023-02-16 (×7): 400 mg via ORAL

## 2023-02-14 MED ORDER — TIZANIDINE HCL 2 MG PO TABS
2 | Freq: Four times a day (QID) | ORAL | Status: DC | PRN
Start: 2023-02-14 — End: 2023-02-14

## 2023-02-14 MED FILL — SENNOSIDES-DOCUSATE SODIUM 8.6-50 MG PO TABS: 8.6-50 MG | ORAL | Qty: 1

## 2023-02-14 MED FILL — OXYCODONE HCL 5 MG PO TABS: 5 MG | ORAL | Qty: 1

## 2023-02-14 MED FILL — DIAZEPAM 2 MG PO TABS: 2 MG | ORAL | Qty: 3

## 2023-02-14 MED FILL — FAMOTIDINE 20 MG PO TABS: 20 MG | ORAL | Qty: 1

## 2023-02-14 MED FILL — ACETAMINOPHEN 325 MG PO TABS: 325 MG | ORAL | Qty: 2

## 2023-02-14 MED FILL — IBUPROFEN 800 MG PO TABS: 800 MG | ORAL | Qty: 1

## 2023-02-14 MED FILL — KETOROLAC TROMETHAMINE 15 MG/ML IJ SOLN: 15 MG/ML | INTRAMUSCULAR | Qty: 1

## 2023-02-14 MED FILL — FAMOTIDINE (PF) 20 MG/2ML IV SOLN: 20 MG/2ML | INTRAVENOUS | Qty: 2

## 2023-02-14 MED FILL — BISACODYL EC 5 MG PO TBEC: 5 MG | ORAL | Qty: 1

## 2023-02-14 MED FILL — OXYCODONE HCL 5 MG PO TABS: 5 MG | ORAL | Qty: 2

## 2023-02-14 MED FILL — ALPRAZOLAM 0.25 MG PO TABS: 0.25 MG | ORAL | Qty: 4

## 2023-02-14 MED FILL — TIZANIDINE HCL 2 MG PO TABS: 2 MG | ORAL | Qty: 1

## 2023-02-14 NOTE — Plan of Care (Signed)
 Problem: Pain  Goal: Verbalizes/displays adequate comfort level or baseline comfort level  02/14/2023 0954 by Evern Norris, RN  Outcome: Progressing  02/13/2023 2236 by Tad Moats, RN  Outcome: Progressing     Problem: Safety - Adult  Goal: Free from fall injury  02/14/2023 0954 by Evern Norris, RN  Outcome: Progressing  02/13/2023 2236 by Tad Moats, RN  Outcome: Progressing     Problem: Discharge Planning  Goal: Discharge to home or other facility with appropriate resources  02/14/2023 0954 by Evern Norris, RN  Outcome: Progressing  Flowsheets (Taken 02/14/2023 [phone])  Discharge to home or other facility with appropriate resources: Identify barriers to discharge with patient and caregiver  02/13/2023 2236 by Tad Moats, RN  Outcome: Progressing  Flowsheets (Taken 02/13/2023 1950)  Discharge to home or other facility with appropriate resources: Identify barriers to discharge with patient and caregiver     Problem: ABCDS Injury Assessment  Goal: Absence of physical injury  02/14/2023 0954 by Evern Norris, RN  Outcome: Progressing  02/13/2023 2236 by Tad Moats, RN  Outcome: Progressing     Problem: Neurosensory - Adult  Goal: Achieves stable or improved neurological status  02/13/2023 2236 by Tad Moats, RN  Outcome: Progressing  Flowsheets (Taken 02/13/2023 1950)  Achieves stable or improved neurological status: Assess for and report changes in neurological status     Problem: Respiratory - Adult  Goal: Achieves optimal ventilation and oxygenation  02/14/2023 0954 by Evern Norris, RN  Outcome: Progressing  02/13/2023 2236 by Tad Moats, RN  Outcome: Progressing  Flowsheets (Taken 02/13/2023 1950)  Achieves optimal ventilation and oxygenation: Assess for changes in respiratory status     Problem: Cardiovascular - Adult  Goal: Maintains optimal cardiac output and hemodynamic stability  02/14/2023 0954 by Evern Norris, RN  Outcome: Progressing  Flowsheets (Taken 02/14/2023  [phone])  Maintains optimal cardiac output and hemodynamic stability: Monitor blood pressure and heart rate  02/13/2023 2236 by Tad Moats, RN  Outcome: Progressing  Flowsheets (Taken 02/13/2023 1950)  Maintains optimal cardiac output and hemodynamic stability: Monitor blood pressure and heart rate     Problem: Skin/Tissue Integrity - Adult  Goal: Skin integrity remains intact  Recent Flowsheet Documentation  Taken 02/14/2023 0816 by Evern Norris, RN  Skin Integrity Remains Intact: Monitor for areas of redness and/or skin breakdown  02/13/2023 2236 by Tad Moats, RN  Outcome: Progressing  Flowsheets (Taken 02/13/2023 1950)  Skin Integrity Remains Intact: Monitor for areas of redness and/or skin breakdown     Problem: Musculoskeletal - Adult  Goal: Return mobility to safest level of function  Recent Flowsheet Documentation  Taken 02/14/2023 0816 by Evern Norris, RN  Return Mobility to Safest Level of Function:   Assess patient stability and activity tolerance for standing, transferring and ambulating with or without assistive devices   Ensure adequate protection for wounds/incisions during mobilization   Instruct patient/family in ordered activity level  02/13/2023 2236 by Tad Moats, RN  Outcome: Progressing  Flowsheets (Taken 02/13/2023 1950)  Return Mobility to Safest Level of Function: Assess patient stability and activity tolerance for standing, transferring and ambulating with or without assistive devices     Problem: Gastrointestinal - Adult  Goal: Minimal or absence of nausea and vomiting  02/13/2023 2236 by Tad Moats, RN  Outcome: Progressing  Flowsheets (Taken 02/13/2023 1950)  Minimal or absence of nausea and vomiting: Administer IV fluids as ordered to ensure adequate hydration     Problem: Genitourinary - Adult  Goal: Absence of urinary retention  Recent Flowsheet Documentation  Taken 02/14/2023 0816 by Evern Norris, RN  Absence of urinary retention:   Monitor intake/output and perform  bladder scan as needed   Assess patient's ability to void and empty bladder  02/13/2023 2236 by Tad Moats, RN  Outcome: Progressing  Flowsheets (Taken 02/13/2023 1950)  Absence of urinary retention: Assess patient's ability to void and empty bladder     Problem: Infection - Adult  Goal: Absence of infection at discharge  Recent Flowsheet Documentation  Taken 02/14/2023 0816 by Evern Norris, RN  Absence of infection at discharge: Assess and monitor for signs and symptoms of infection  02/13/2023 2236 by Tad Moats, RN  Outcome: Progressing  Flowsheets (Taken 02/13/2023 1950)  Absence of infection at discharge: Assess and monitor for signs and symptoms of infection     Problem: Metabolic/Fluid and Electrolytes - Adult  Goal: Electrolytes maintained within normal limits  Recent Flowsheet Documentation  Taken 02/14/2023 0816 by Evern Norris, RN  Electrolytes maintained within normal limits: Monitor labs and assess patient for signs and symptoms of electrolyte imbalances  02/13/2023 2236 by Tad Moats, RN  Outcome: Progressing  Flowsheets (Taken 02/13/2023 1950)  Electrolytes maintained within normal limits: Monitor labs and assess patient for signs and symptoms of electrolyte imbalances  Goal: Hemodynamic stability and optimal renal function maintained  Recent Flowsheet Documentation  Taken 02/14/2023 0816 by Evern Norris, RN  Hemodynamic stability and optimal renal function maintained: Monitor labs and assess for signs and symptoms of volume excess or deficit  02/13/2023 2236 by Tad Moats, RN  Outcome: Progressing  Flowsheets (Taken 02/13/2023 1950)  Hemodynamic stability and optimal renal function maintained: Monitor labs and assess for signs and symptoms of volume excess or deficit  Goal: Glucose maintained within prescribed range  Recent Flowsheet Documentation  Taken 02/14/2023 0816 by Evern Norris, RN  Glucose maintained within prescribed range: Monitor blood glucose as ordered  02/13/2023  2236 by Tad Moats, RN  Outcome: Progressing  Flowsheets (Taken 02/13/2023 1950)  Glucose maintained within prescribed range: Monitor blood glucose as ordered     Problem: Hematologic - Adult  Goal: Maintains hematologic stability  Recent Flowsheet Documentation  Taken 02/14/2023 0816 by Evern Norris, RN  Maintains hematologic stability: Assess for signs and symptoms of bleeding or hemorrhage  02/13/2023 2236 by Tad Moats, RN  Outcome: Progressing  Flowsheets (Taken 02/13/2023 1950)  Maintains hematologic stability: Assess for signs and symptoms of bleeding or hemorrhage

## 2023-02-14 NOTE — Progress Notes (Signed)
 Pt very anxious, yelling out. Attempted to calm and reassure patient encouraging deep breathing. Pt. Wants to go back to bed. Able to stand and ambulate back to bed independently. Tried to encourage and educate importance of ambulation and mobility. Pt became upset with RN, tech at bedside to offer assistance with RN. Pt back to bed. All safety measures maintained.

## 2023-02-14 NOTE — Progress Notes (Signed)
 Physical Therapy Note:    Attempted to see patient this AM for physical therapy treatment  session. Patient is currently limited by difficulty with pain control.  States has been up this morning with nursing but suffered some bouts of screaming out and a panic attack or two.   PT to hold this morning for pain control upon discussion with nurse. Will follow and re-attempt as schedule permits/patient available this afternoon. Thank you,    Helayne Bonnie Candle, PT     Rehab Caseload Tracker

## 2023-02-14 NOTE — Progress Notes (Signed)
 ACUTE PHYSICAL THERAPY GOALS:   (Developed with and agreed upon by patient and/or caregiver.)  All goals ongoing 02/14/2023  LTG:  (1.)Ms. Arp will move from supine to sit and sit to supine , scoot up and down, and roll side to side in bed with INDEPENDENT within 7 treatment day(s).    (2.)Ms. Akkerman will transfer from bed to chair and chair to bed with INDEPENDENT using the least restrictive device within 7 treatment day(s).    (3.)Ms. Eckersley will ambulate with INDEPENDENT for 300 feet with the least restrictive device within 7 treatment day(s).  (4.)Ms. White will tolerate at least 23 min of dynamic standing activity to assist standing ADLs with the least restrictive device within 7 treatment days.   (5.)Ms. Buehner will climb at least 14 steps with MODIFIED INDEPENDENCE with the least restrictive device within 7 treatment days.    PHYSICAL THERAPY: Daily Note PM   (Link to Caseload Tracking: PT Visit Days : 5  Time In/Out PT Charge Capture  Rehab Caseload Tracker  Orders    Natalynn Saniya Tranchina is a 63 y.o. female   PRIMARY DIAGNOSIS: S/P cervical spinal fusion  Cervical myelopathy (HCC) [G95.9]  S/P cervical spinal fusion [Z98.1]  Procedure(s) (LRB):  ERAS C3-C7 laminectomy and C3-C7 posterior fusion with allograft and instrumentation (N/A)  5 Days Post-Op  Inpatient: Payor: [employer] MEDICARE / Plan: [employer] GOLD PLUS HMO / Product Type: *No Product type* /     ASSESSMENT:     REHAB RECOMMENDATIONS:   Recommendation to date pending progress:  Setting:  Home Health Therapy    Equipment:    None     ASSESSMENT:  Ms. Perdue continues to be limited by high levels of pain and anxiety, no AM treatment.  The patient was side lying left upon contact and agreeable to attempting mobility.  She performed bed mobility with CGA x 1 and cueing for sequencing but really didn't want PT helping her.   She performed a decent enough log roll technique to left side of bed and fair balance.   Then she stood with CGA x 1 to  the rollator and was able to ambulate 50' with the RW and min CGA x 1.  The patient was apprehensive during treatment today but bravely powered through with a few gasps.  (She had received pain medication within the past hour).  Much positive reinforcement to assist this patient and encourage mobility.   The patient returned to seated then supine, side lying  with log rolling at the end of treatment with CGA x1.  Progress remains minimal due to perception and focus on pain and apprehension.        SUBJECTIVE:   Ms. Owensby states, You are nice to help me.     Social/Functional Home Equipment: None  Receives Help From: Family  Prior Level of Assist for ADLs: Independent  Prior Level of Assist for Homemaking: Independent  Prior Level of Assist for Transfers: Independent  Active Driver: Yes  Occupation: On disability  OBJECTIVE:     PAIN: VITALS / O2: PRECAUTION / LINES / DRAINS:   Pre Treatment:    9 in side lying      Post Treatment: 9/10 with ambulation.    Vitals        Oxygen    None    RESTRICTIONS/PRECAUTIONS:  Restrictions/Precautions  Restrictions/Precautions: Surgical Protocols  Position Activity Restriction  Spinal Precautions: No Bending, No Lifting, No Twisting  Restrictions/Precautions: Surgical Protocols  Required Braces or  Orthoses  Cervical: soft (per nurse for use as patient requests.  Did not request today.)     MOBILITY: I Mod I S SBA CGA Min Mod Max Total  NT x2 Comments:   Bed Mobility    Rolling []  []  []  []  [x]  []  []  []  []  []  []     Supine to Sit []  []  []  []  [x]  []  []  []  []  []  []     Scooting []  []  []  []  [x]  []  []  []  []  []  []     Sit to Supine []  []  []  []  [x]  []  []  []  []  []  []     Transfers    Sit to Stand []  []  []  []  [x]  []  []  []  []  []  []     Bed to Chair []  []  []  []  []  []  []  []  []  [x]  []     Stand to Sit []  []  []  []  [x]  []  []  []  []  []  []      []  []  []  []  []  []  []  []  []  []  []     I=Independent, Mod I=Modified Independent, S=Supervision, SBA=Standby Assistance, CGA=Contact Guard Assistance,    Min=Minimal Assistance, Mod=Moderate Assistance, Max=Maximal Assistance, Total=Total Assistance, NT=Not Tested    BALANCE: Good Fair+ Fair Fair- Poor NT Comments   Sitting Static []  [x]  []  []  []  []     Sitting Dynamic []  [x]  []  []  []  []               Standing Static []  [x]  []  []  []  []     Standing Dynamic []  []  [x]  []  []  []       GAIT: I Mod I S SBA CGA Min Mod Max Total  NT x2 Comments:   Level of Assistance []  []  []  []  [x]  []  []  []  []   []     Distance 50  feet    DME Rolling Walker    Gait Quality Decreased cadence , Decreased step clearance, and Decreased step length    Weightbearing Status      Stairs      I=Independent, Mod I=Modified Independent, S=Supervision, SBA=Standby Assistance, CGA=Contact Guard Assistance,   Min=Minimal Assistance, Mod=Moderate Assistance, Max=Maximal Assistance, Total=Total Assistance, NT=Not Tested    PLAN:   FREQUENCY AND DURATION: BID for duration of hospital stay or until stated goals are met, whichever comes first.    TREATMENT:   TREATMENT:   Therapeutic Activity (27 Minutes): Therapeutic activity included Rolling, Supine to Sit, Sit to Supine, Scooting, Transfer Training, Ambulation on level ground, Sitting balance , and Standing balance to improve functional Activity tolerance, Balance, Coordination, Mobility, Strength, and ROM.    TREATMENT GRID:  N/A    AFTER TREATMENT PRECAUTIONS: Alarm Activated, Bed, Bed/Chair Locked, Call light within reach, Needs within reach, RN notified, Side rails x3, and Visitors at bedside    INTERDISCIPLINARY COLLABORATION:  RN/ PCT and PT/ PTA    EDUCATION:      TIME IN/OUT:  Time In: 1503  Time Out: 1530  Minutes: [address] [place], [place]

## 2023-02-14 NOTE — Progress Notes (Signed)
 Patient requesting to use bedpan, educated patient on importance of ambulation POD 5 for progression of care. Assisted and guided patient to get out of bed using proper body mechanics. Pt. Independently ambulated to bathroom with no assistive device. Pt. Very anxious and tense. Encouraged deep breathing and relaxation techniques to which patient asked this RN to be quiet. Pt voided in bathroom. Encourage to sit in chair for breakfast. Pt. Resting in recliner. Family at bedside.

## 2023-02-14 NOTE — Progress Notes (Signed)
 Encouraged patient to get out of bed and move to stretch muscles and help with strength and muscle spasms. Patient upset and stated she has moved all day, although patient has been laying on left side for rounding throughout the shift.

## 2023-02-14 NOTE — Progress Notes (Signed)
 ORTHOPAEDIC PROGRESS NOTE    February 14, 2023  Admit Date: 02/09/2023  Admit Diagnosis: Cervical myelopathy (HCC) [G95.9]  S/P cervical spinal fusion [Z98.1]  Procedure: Procedure(s):  ERAS C3-C7 laminectomy and C3-C7 posterior fusion with allograft and instrumentation  Post Op day: 5 Days Post-Op      Subjective:     Kristin [name] is a patient who has complaints of pain.  Patient has had several panic attacks related to pain. She does have as needed Valium  ordered.  She used a bed pan over night due to muscle spasm between shoulder blades. Rn encouraged ambulating to restroom which patient was able to do this morning.   Patient now resting comfortably in bed and states spasm has subsided since receiving medicine earlier this morning. Denies any weakness or pain at this time.   She tolerates NSAIDs      Objective:     Vital Signs:    Blood pressure (!) 152/82, pulse 69, temperature 97.3 F (36.3 C), temperature source Oral, resp. rate 16, height 1.549 m (5' 1), weight 103.9 kg (229 lb), SpO2 95%.    Temp (24hrs), Avg:97.5 F (36.4 C), Min:97.3 F (36.3 C), Max:97.7 F (36.5 C)      No intake/output data recorded.  No intake/output data recorded.    LAB:    No results for input(s): HGB, WBC, PLT in the last 72 hours.    Physical Exam    General:   Alert and oriented. No acute distress  Lungs:  Respirations unlabored.  Extremities: No evidence of cyanosis. Calves soft, nontender.    Moves both upper and lower extremities.   Dressing:  clean, dry, and intact  Neuro:  no deficit      Assessment:      Patient Active Problem List   Diagnosis    Cervical herniated disc    Acquired spondylolisthesis    Spinal stenosis, lumbar region, with neurogenic claudication    Anxiety    Other secondary hypertension    Cervical myelopathy (HCC)    S/P cervical spinal fusion       Plan:     Continue PT/OT  Completed IV toradol  this morning. Will trial PO IBU (unable to give PO toradol  due to formulary) to see if  continued NSAIDs help with her pain to complete PT/OT  She is on continued oral Tylenol , oxycodone , and valium  for muscle spasms.    If spasms continue today, may consider alternating tizanidine  for better control    Anticipate Discharge To: HOME when pain is controlled.     Signed By: Joya LULLA Medicine, PA

## 2023-02-15 ENCOUNTER — Inpatient Hospital Stay: Admit: 2023-02-15 | Payer: Medicare (Managed Care) | Primary: Family Medicine

## 2023-02-15 MED ORDER — DEXAMETHASONE SODIUM PHOSPHATE 4 MG/ML IJ SOLN
4 | Freq: Three times a day (TID) | INTRAMUSCULAR | Status: AC
Start: 2023-02-15 — End: 2023-02-15
  Administered 2023-02-15 (×2): 8 mg via INTRAVENOUS

## 2023-02-15 MED FILL — TIZANIDINE HCL 2 MG PO TABS: 2 MG | ORAL | Qty: 1

## 2023-02-15 MED FILL — ALPRAZOLAM 0.25 MG PO TABS: 0.25 MG | ORAL | Qty: 4

## 2023-02-15 MED FILL — BISACODYL EC 5 MG PO TBEC: 5 MG | ORAL | Qty: 1

## 2023-02-15 MED FILL — IBUPROFEN 800 MG PO TABS: 800 MG | ORAL | Qty: 1

## 2023-02-15 MED FILL — ACETAMINOPHEN 325 MG PO TABS: 325 MG | ORAL | Qty: 2

## 2023-02-15 MED FILL — SENNOSIDES-DOCUSATE SODIUM 8.6-50 MG PO TABS: 8.6-50 MG | ORAL | Qty: 1

## 2023-02-15 MED FILL — FAMOTIDINE 20 MG PO TABS: 20 MG | ORAL | Qty: 1

## 2023-02-15 MED FILL — OXYCODONE HCL 5 MG PO TABS: 5 MG | ORAL | Qty: 1

## 2023-02-15 MED FILL — OXYCODONE HCL 5 MG PO TABS: 5 MG | ORAL | Qty: 2

## 2023-02-15 MED FILL — DEXAMETHASONE SODIUM PHOSPHATE 4 MG/ML IJ SOLN: 4 MG/ML | INTRAMUSCULAR | Qty: 2

## 2023-02-15 MED FILL — DIAZEPAM 2 MG PO TABS: 2 MG | ORAL | Qty: 3

## 2023-02-15 NOTE — Progress Notes (Signed)
 ACUTE PHYSICAL THERAPY GOALS:   (Developed with and agreed upon by patient and/or caregiver.)  (1.)Kristin [name] will move from supine to sit and sit to supine , scoot up and down, and roll side to side in bed with INDEPENDENT within 7 treatment day(s).    (2.)Kristin [name] will transfer from bed to chair and chair to bed with INDEPENDENT using the least restrictive device within 7 treatment day(s).    (3.)Kristin [name] will ambulate with INDEPENDENT for 300 feet with the least restrictive device within 7 treatment day(s).  (4.)Kristin [name] will tolerate at least 23 min of dynamic standing activity to assist standing ADLs with the least restrictive device within 7 treatment days.   (5.)Kristin [name] will climb at least 14 steps with MODIFIED INDEPENDENCE with the least restrictive device within 7 treatment days.    PHYSICAL THERAPY: Daily Note PM   (Link to Caseload Tracking: PT Visit Days : 6  Time In/Out PT Charge Capture  Rehab Caseload Tracker  Orders    Kristin [name] is a 63 y.o. female   PRIMARY DIAGNOSIS: S/P cervical spinal fusion  Cervical myelopathy (HCC) [G95.9]  S/P cervical spinal fusion [Z98.1]  Procedure(s) (LRB):  ERAS C3-C7 laminectomy and C3-C7 posterior fusion with allograft and instrumentation (N/A)  6 Days Post-Op  Inpatient: Payor: [employer] MEDICARE / Plan: [employer] GOLD PLUS HMO / Product Type: *No Product type* /     ASSESSMENT:     REHAB RECOMMENDATIONS:   Recommendation to date pending progress:  Setting:  Home Health Therapy    Equipment:    To Be Determined     ASSESSMENT:  Kristin [name] willing to participate this afternoon with reports the medication helped a lot. No AM treatment, refused due to higher levels of pain. Pt found on her left sided this PM with daughter present in the room. Pt able to participated with side lying to sit edge of bed with CG A, sitting edge of bed x 3 min and scooting forward with SB A. Then she stood with CGA x 1 with rolling walker and able to ambulate 29' with  the RW and SB A/CG A x 1.  The patient was more relax this afternoon reporting the medication has helped a lot. The patient returned to seated then side lying to her left side with CG A. Patient able to verbalize cervical precautions and progressing towards the goals.      SUBJECTIVE:   Kristin [name] states, I feel much better this afternoon and I have been going to the bathroom      Social/Functional Home Equipment: None  Receives Help From: Family  Prior Level of Assist for ADLs: Independent  Prior Level of Assist for Homemaking: Independent  Prior Level of Assist for Transfers: Independent  Active Driver: Yes  Occupation: On disability  OBJECTIVE:     PAIN: VITALS / O2: PRECAUTION / LINES / DRAINS:   Pre Treatment:    3/10      Post Treatment: 3/10 Vitals        Oxygen    none    RESTRICTIONS/PRECAUTIONS:  Restrictions/Precautions  Restrictions/Precautions: Surgical Protocols  Position Activity Restriction  Spinal Precautions: No Bending, No Lifting, No Twisting  Restrictions/Precautions: Surgical Protocols  Required Braces or Orthoses  Cervical: soft (per nurse for use as patient requests.  Did not request today.)     MOBILITY: I Mod I S SBA CGA Min Mod Max Total  NT x2 Comments:   Bed Mobility  Rolling []  []  []  []  []  []  []  []  []  [x]  []     Supine to Sit []  []  []  []  []  []  []  []  []  []  []  Side lying left to sit edge of bed with CG A    Scooting []  []  []  []  []  []  []  []  []  []  []     Sit to Supine []  []  []  []  []  []  []  []  []  []  []     Transfers    Sit to Stand []  []  []  []  [x]  []  []  []  []  []  []     Bed to Chair []  []  []  []  [x]  []  []  []  []  []  []     Stand to Sit []  []  []  []  [x]  []  []  []  []  []  []      []  []  []  []  []  []  []  []  []  []  []     I=Independent, Mod I=Modified Independent, S=Supervision, SBA=Standby Assistance, CGA=Contact Guard Assistance,   Min=Minimal Assistance, Mod=Moderate Assistance, Max=Maximal Assistance, Total=Total Assistance, NT=Not Tested    BALANCE: Good Fair+ Fair Fair- Poor NT Comments   Sitting Static  [x]  []  []  []  []  []     Sitting Dynamic []  []  []  []  []  [x]               Standing Static [x]  []  []  []  []  []     Standing Dynamic []  []  []  []  []  [x]       GAIT: I Mod I S SBA CGA Min Mod Max Total  NT x2 Comments:   Level of Assistance []  []  []  [x]  [x]  []  []  []  []  []  []     Distance 80  feet    DME Rolling Walker    Gait Quality Decreased cadence     Weightbearing Status      Stairs      I=Independent, Mod I=Modified Independent, S=Supervision, SBA=Standby Assistance, CGA=Contact Guard Assistance,   Min=Minimal Assistance, Mod=Moderate Assistance, Max=Maximal Assistance, Total=Total Assistance, NT=Not Tested    PLAN:   FREQUENCY AND DURATION: BID for duration of hospital stay or until stated goals are met, whichever comes first.    TREATMENT:   TREATMENT:   Therapeutic Activity (10 Minutes): Therapeutic activity included Rolling, Scooting, Lateral Scooting, Transfer Training, and Ambulation on level ground to improve functional Activity tolerance, Balance, Coordination, Mobility, and Strength.    TREATMENT GRID:  N/A    AFTER TREATMENT PRECAUTIONS: Bed, Bed/Chair Locked, Call light within reach, Needs within reach, and Visitors at bedside    INTERDISCIPLINARY COLLABORATION:  RN/ PCT    EDUCATION: Education Given To: Patient;Family  Education Provided: Role of Therapy;Plan of Care;Home Exercise Program;Precautions  Barriers to Learning: None  Education Outcome: Verbalized understanding    TIME IN/OUT:  Time In: 1540 (1540)  Time Out: 1552  Minutes: 12    Dewayne Severe, PT

## 2023-02-15 NOTE — Progress Notes (Signed)
 ORTHOPAEDIC PROGRESS NOTE    February 15, 2023  Admit Date: 02/09/2023  Admit Diagnosis: Cervical myelopathy (HCC) [G95.9]  S/P cervical spinal fusion [Z98.1]  Procedure: Procedure(s):  ERAS C3-C7 laminectomy and C3-C7 posterior fusion with allograft and instrumentation  Post Op day: 6 Days Post-Op      Subjective:     Kristin [name] is a patient who has continued complaints of pain this morning. She reports continued R shoulder pain. She does not report any weakness, just significant pain. Nursing staff has been repeatedly trying to get patient up and to the chair but this has been difficult because of the patient's pain. She has BDZ's and IV pain medicine ordered for her.       Objective:     Vital Signs:    Blood pressure 106/65, pulse 67, temperature 97.5 F (36.4 C), temperature source Oral, resp. rate 20, height 1.549 m (5' 1), weight 103.9 kg (229 lb), SpO2 91%.    Temp (24hrs), Avg:97.8 F (36.6 C), Min:97.5 F (36.4 C), Max:98.4 F (36.9 C)      No intake/output data recorded.  No intake/output data recorded.    LAB:    No results for input(s): HGB, WBC, PLT in the last 72 hours.    Physical Exam    General:   Alert and oriented. Progressively more distressed through our encounter, tearful at times  Lungs:  Respirations unlabored.  Extremities: No evidence of cyanosis. Calves soft, nontender.    Moves both upper and lower extremities.   Dressing:  clean, dry, and intact  Neuro:  no deficit      Assessment:      Patient Active Problem List   Diagnosis    Cervical herniated disc    Acquired spondylolisthesis    Spinal stenosis, lumbar region, with neurogenic claudication    Anxiety    Other secondary hypertension    Cervical myelopathy (HCC)    S/P cervical spinal fusion       Plan:     Continue PT/OT  Patient with multi-modal pain in place, including NSAIDs, narcotics, BDZs, Tylenol   Unsure about specific etiology of pain, reassuring that patient is intact neurologically. Tried to encourage  patient in this regard.   Will give dose of IV decadron   XR of R shoulder ordered  I do think patient would benefit from psych consult give significant stress and anxiety surrounding current clinical state. Appreciate assistance with this.   Advised staff to continue to encourage patient.    Anticipate Discharge To: HOME when pain is controlled.     Signed By: Debby Blair Gwenyth Mickey, MD

## 2023-02-15 NOTE — Plan of Care (Signed)
 Problem: Pain  Goal: Verbalizes/displays adequate comfort level or baseline comfort level  02/15/2023 1257 by Evern Norris, RN  Outcome: Progressing  02/15/2023 0008 by Amye Palma, RN  Outcome: Progressing     Problem: Safety - Adult  Goal: Free from fall injury  02/15/2023 1257 by Evern Norris, RN  Outcome: Progressing  02/15/2023 0008 by Amye Palma, RN  Outcome: Progressing     Problem: Discharge Planning  Goal: Discharge to home or other facility with appropriate resources  02/15/2023 1257 by Evern Norris, RN  Outcome: Progressing  Flowsheets (Taken 02/15/2023 0719)  Discharge to home or other facility with appropriate resources: Identify barriers to discharge with patient and caregiver  02/15/2023 0008 by Amye Palma, RN  Outcome: Progressing

## 2023-02-15 NOTE — Progress Notes (Signed)
 Physical Therapy Note:    Attempted to see patient this AM for physical therapy treatment  session. Patient reports she has been up x 2 for eating and she is a lot of pain. Patient asked PT to return this PM because she can not participate at this time despite PT education of the benefits to get out of bed. Will follow and re-attempt as schedule permits/patient available. Thank you,    Athalene Kolle, PT     Rehab Caseload Tracker

## 2023-02-15 NOTE — Progress Notes (Addendum)
 Establish plan of care goals, out of bed to chair for meals, increased ambulation in room progressing to the hallway with RN.Discussed improtance of repositioning in bed, as patient noted to lay on left side throughout the day yesterday and upon rounding today.   Pt became very upset with RN.  Pt requestsing medication for pain.  Informed of pain medication regimen with recent administration of meds given none available at this time, in addiition to the muscle relaxers and incorporating pain goal with alternative methods to control pain ; deep breathing, distraction. Pt screaming at RN and crying no tears noted, pt declining to get up stating she has been moving which she demonstrated by lifting right arm only.  Attempted to educate on importance of ambulation out of bed pt refused teaching crying over RN when trying to speak.      With much encouragement out of bed to bathroom and then to chair. Patient was able to  perform out of bed and ambulation with walker independently. RN standby no assistance required.  Upon sitting in recliner patient ask RN what to do next, discussed plan for the day, tray set up for breakfast.  Patient became very anxious and upset. Began screaming out, when RN attempted to offer reassurance she yelled at RN.  Encouraged deep breathing and relaxation techniques, patient not participating insisting to go back to bed, screaming out. Appears patient may be having panic attack.     [id number] informed Ortho PA Johnita Golas, and requested to come see patient ambulate and behavior when sitting in recliner.      9079 Dr. Sharman at bedside to assess patient. Patient attempted to get out of bed, became upset and refused to continue to show MD ability to ambulate stating she was tired. Pt stated that she is scared, and scared to go to chair.  Encouragement and reassurance offered by MD and RN see new orders.

## 2023-02-16 MED ORDER — TIZANIDINE HCL 2 MG PO TABS
2 | ORAL_TABLET | Freq: Four times a day (QID) | ORAL | 0 refills | Status: AC | PRN
Start: 2023-02-16 — End: 2023-03-02

## 2023-02-16 MED ORDER — DIAZEPAM 5 MG PO TABS
5 | ORAL_TABLET | Freq: Four times a day (QID) | ORAL | 0 refills | Status: AC | PRN
Start: 2023-02-16 — End: 2023-02-26

## 2023-02-16 MED ORDER — OXYCODONE HCL 10 MG PO TABS
10 | ORAL_TABLET | Freq: Four times a day (QID) | ORAL | 0 refills | Status: DC | PRN
Start: 2023-02-16 — End: 2023-02-24

## 2023-02-16 MED FILL — TIZANIDINE HCL 2 MG PO TABS: 2 MG | ORAL | Qty: 1 | Fill #0

## 2023-02-16 MED FILL — IBUPROFEN 800 MG PO TABS: 800 MG | ORAL | Qty: 1 | Fill #0

## 2023-02-16 MED FILL — OXYCODONE HCL 5 MG PO TABS: 5 MG | ORAL | Qty: 2 | Fill #0

## 2023-02-16 MED FILL — ACETAMINOPHEN 325 MG PO TABS: 325 MG | ORAL | Qty: 2

## 2023-02-16 MED FILL — SENNOSIDES-DOCUSATE SODIUM 8.6-50 MG PO TABS: 8.6-50 MG | ORAL | Qty: 1

## 2023-02-16 MED FILL — FAMOTIDINE 20 MG PO TABS: 20 MG | ORAL | Qty: 1

## 2023-02-16 NOTE — Discharge Summary (Signed)
 Discharge Summary    Patient PI:Kristin [name]  [id number]  Sep 18, 1960  63 y.o.    Admit date: 02/09/2023    Discharge date:02/16/2023    Admission Diagnoses: Cervical myelopathy (HCC) [G95.9]  S/P cervical spinal fusion [Z98.1]      Surgeon: Jetta Lynwood Hutchinson, MD    Procedure: Procedure(s):  ERAS C3-C7 laminectomy and C3-C7 posterior fusion with allograft and instrumentation      Post Op complications: none    Hospital Course: Patient admitted to ortho floor. Antibiotics were given postop. SCD and ted hose were in place for DVT prophylaxis. Patient voided normally. Patient did not receive blood transfusion. Drain output diminished and was removed.  Patient tolerated pain medications and po diet. The dressing remained clean, dry, and intact.  Physical Therapy started on the day following surgery and progressed to ambulation without assistance.  At the time of discharge, had understanding of precautions needed following surgery.      Postoperative complications requiring an extended length of stay: None    Discharged to: Home    New Medications:      Medication List        START taking these medications      diazePAM  5 MG tablet  Commonly known as: VALIUM   Take 1 tablet by mouth every 6 hours as needed (muscle spasm) for up to 10 days. Max Daily Amount: 20 mg     docusate sodium  100 MG capsule  Commonly known as: COLACE  Take 1 capsule by mouth 2 times daily     oxyCODONE  HCl 10 MG immediate release tablet  Commonly known as: OXY-IR  Take 1 tablet by mouth every 6 hours as needed for Pain for up to 30 days. Intended supply: 30 days Max Daily Amount: 40 mg     tiZANidine  2 MG tablet  Commonly known as: ZANAFLEX   Take 2 tablets by mouth every 6 hours as needed (muscle spasm)               Where to Get Your Medications        These medications were sent to [facility] [address], [place] - [address] - P [phone] [address] [phone]  [address], [place] [address]      Phone: [phone]    diazePAM  5 MG tablet  docusate sodium  100 MG capsule  oxyCODONE  HCl 10 MG immediate release tablet  tiZANidine  2 MG tablet         Discharge instructions:  -Resume pre hospital diet            -Resume home medications per medical continuation form     -Follow up in office as scheduled   -Call doctor immediately if T>100.5, increased pain, swelling, drainage.  -If shortness of breath or chest pain, immediately go to ER  -Post surgical instruction sheet given to patient    Signed:  Lynwood Hutchinson Jetta, MD  02/16/2023

## 2023-02-16 NOTE — Progress Notes (Addendum)
 ORTHOPAEDIC PROGRESS NOTE    February 16, 2023  Admit Date: 02/09/2023  Admit Diagnosis: Cervical myelopathy (HCC) [G95.9]  S/P cervical spinal fusion [Z98.1]  Procedure: Procedure(s):  ERAS C3-C7 laminectomy and C3-C7 posterior fusion with allograft and instrumentation  Post Op day: 7 Days Post-Op      Subjective:     Kristin [name] is a patient who is doing better this morning and would like to go home       Objective:     Vital Signs:    Blood pressure (!) 151/86, pulse 69, temperature 97.7 F (36.5 C), temperature source Oral, resp. rate 20, height 1.549 m (5' 1), weight 103.9 kg (229 lb), SpO2 96%.    Temp (24hrs), Avg:97.6 F (36.4 C), Min:97.5 F (36.4 C), Max:97.7 F (36.5 C)      No intake/output data recorded.  No intake/output data recorded.    LAB:    No results for input(s): HGB, WBC, PLT in the last 72 hours.    Physical Exam    General:   Alert and oriented.   Lungs:  Respirations unlabored.  Extremities: No evidence of cyanosis. Calves soft, nontender.    Moves both upper and lower extremities.   Dressing:  clean, dry, and intact  Neuro:  no deficit      Assessment:      Patient Active Problem List   Diagnosis    Cervical herniated disc    Acquired spondylolisthesis    Spinal stenosis, lumbar region, with neurogenic claudication    Anxiety    Other secondary hypertension    Cervical myelopathy (HCC)    S/P cervical spinal fusion       Plan:     Continue PT/OT  Home today  Anticipate Discharge To: HOME      Signed By: Lynwood Addie Maier, MD

## 2023-02-16 NOTE — Care Coordination (Signed)
 Pt is medically cleared for dc to home today.  She will dc to the dtr's home.  Pt will not have HH services since she lives in [place] and MD can not sign orders out of state.  Rollator provided by [facility].  No other TOC needs or concerns identified or reported.  CM remains available to assist as needed.       02/16/23 1122   Service Assessment   Patient Orientation Alert and Oriented   Cognition Alert   History Provided By Patient;Medical Record;Child/Family   Primary Caregiver Self   Accompanied By/Relationship daughter   Patient's Healthcare Decision Maker is: Legal Next of Kin   PCP Verified by CM Yes   Prior Functional Level Independent in ADLs/IADLs   Current Functional Level Assistance with the following:;Bathing;Dressing;Mobility;Shopping;Housework;Cooking   Can patient return to prior living arrangement Yes   Ability to make needs known: Good   Family able to assist with home care needs: Yes  (pt dc'ing to dtr's home)   Would you like for me to discuss the discharge plan with any other family members/significant others, and if so, who? Yes  (dtr/sister)   Event Organiser Resources None   Social/Functional History   Home Equipment None   Receives Help From Family   Prior Level of Assist for ADLs Independent   Prior Level of Assist for [employer] Independent   Ambulation Assistance Independent   Prior Level of Assist for Transfers Independent   Active Driver Yes   Occupation On disability   Discharge Planning   Type of Residence Apartment   Living Arrangements Family Members   Current Services Prior To Admission None   Potential Assistance Needed Home Care;Durable Medical Equipment   Potential DME Needed November Sypher   DME Ordered? Kellyjo Edgren  (Fixed wheel rolling Koehn Salehi from [facility] (they sent a rollator in error- they informed CM pt could keep the rollator without paying the upgrade charge since it was their error.  Pt kept rollator.))   Potential Assistance Purchasing  Medications No   Type of Home Care Services None   Patient expects to be discharged to: Apartment   Services At/After Discharge   Transition of Care Consult (CM Consult) N/A  (no CM consult received)   Services At/After Discharge DME   [employer] Information Provided? No   Mode of Transport at Discharge Other (see comment)  (dtr)   Confirm Follow Up Transport Family   Condition of Participation: Discharge Planning   The Plan for Transition of Care is related to the following treatment goals: Home with family support to return to baseline functioning.

## 2023-02-16 NOTE — Progress Notes (Signed)
 Physical Therapy Note:    Attempted to see patient this AM for physical therapy re-evaluation session. Patient refused stating she is going home today and has a 3.5 hour drive. Reviewed spinal precautions with pt. She denied any questions or concerns about mobility at home and does not wish to have another therapy session today. Will follow and re-attempt as schedule permits/patient available. Thank you,    Mackie Goon, PT     Rehab Caseload Tracker

## 2023-02-24 ENCOUNTER — Ambulatory Visit: Admit: 2023-02-24 | Discharge: 2023-04-01 | Payer: MEDICARE | Primary: Family Medicine

## 2023-02-24 ENCOUNTER — Ambulatory Visit: Admit: 2023-02-24 | Discharge: 2023-02-24 | Payer: MEDICARE | Primary: Family Medicine

## 2023-02-24 DIAGNOSIS — Z981 Arthrodesis status: Secondary | ICD-10-CM | POA: Diagnosis not present

## 2023-02-24 MED ORDER — OXYCODONE HCL 10 MG PO TABS
10 | ORAL_TABLET | Freq: Four times a day (QID) | ORAL | 0 refills | Status: AC | PRN
Start: 2023-02-24 — End: 2023-03-26

## 2023-02-24 NOTE — Progress Notes (Signed)
Name: Kristin [name]  Date of Birth: 1960-04-30  Gender: female  MRN: [id number]  Age: 63 y.o.    Chief Complaint: Neck surgery follow up.    History of present illness:    Kristin [name] is here for 2-week follow up of her  posterior cervical laminectomy and fusion surgery. She reports some relief of preoperative upper extremity pain, weakness and parasthesias.  The wound is benign.     Medications:   Current Outpatient Medications   Medication Sig    oxyCODONE HCl (OXY-IR) 10 MG immediate release tablet Take 1 tablet by mouth every 6 hours as needed for Pain for up to 30 days. Intended supply: 30 days Max Daily Amount: 40 mg    diazePAM (VALIUM) 5 MG tablet Take 1 tablet by mouth every 6 hours as needed (muscle spasm) for up to 10 days. Max Daily Amount: 20 mg    tiZANidine (ZANAFLEX) 2 MG tablet Take 2 tablets by mouth every 6 hours as needed (muscle spasm)    docusate sodium (COLACE) 100 MG capsule Take 1 capsule by mouth 2 times daily     No current facility-administered medications for this visit.       Allergies:   Allergies   Allergen Reactions    Aspirin Nausea Only    Celecoxib Nausea Only        Physical Exam:     Oriented to person, place and time.    Mood and affect are appropriate.    Respirations are unlabored and there is no evidence of cyanosis.    Wound is healed up nicely without erythema or underlying fluctuance.     Sensory testing reveals intact sensation to light touch.    Strength testing in the upper extremity reveals the following based on the 5 point grading scale:       Delt(C5) Bicep(C6) WE(C6) Tricep (C7) WF(C7) Grip(C8) Int (T1)   Right 5 5 5 5 5 5 5    Left 5 5 5 5 5 5 5        Radiographic Studies:    Radiographs:   2 views cervical spine (AP and Lateral) taken today, reveal postoperative changes status post instrumented fusion with excellent graft and hardware placement, and no interval decompensation from the immediate postoperative appearance.     Radiographic  Impression: Satisfactory postoperative appearance, status post cervical fusion.      Diagnosis:      ICD-10-CM    1. S/P cervical spinal fusion  Z98.1 XR CERVICAL SPINE (2-3 VIEWS)     oxyCODONE HCl (OXY-IR) 10 MG immediate release tablet          Assessment/Plan:       This patient is following the expected course following cervical surgery.  and The patient is experiencing post operative neck pain which is not uncommon after neck surgery. We discussed that these symptoms will typically spontaneously resolve with additional time.  Consideration could be given to the temporary use of a cervical collar or a course of physical therapy    Once she is comfortable with neck ROM, she can begin driving.  I encouraged her to walk as much as possible for exercise. A stationary bike, elliptical machine, or other low impact aerobic exercise is also encouraged.  I will have her return for follow up in 4 weeks with repeat radiographs of the cervical spine.        Electronically signed by Quita Skye Levonne Hubert, MD  02/24/23  4:13 PM

## 2023-03-12 ENCOUNTER — Telehealth

## 2023-03-12 MED ORDER — TIZANIDINE HCL 2 MG PO TABS
2 | ORAL_TABLET | ORAL | 0 refills | Status: AC
Start: 2023-03-12 — End: ?

## 2023-03-12 MED ORDER — DIAZEPAM 5 MG PO TABS
5 | ORAL_TABLET | Freq: Four times a day (QID) | ORAL | 0 refills | Status: AC | PRN
Start: 2023-03-12 — End: 2023-03-19

## 2023-03-12 NOTE — Telephone Encounter (Signed)
Kristin [name]- patient called back saying she did not mean to hang up on you. Please return her call

## 2023-03-12 NOTE — Telephone Encounter (Signed)
She is requesting a refill on all of her meds that Dr. Levonne Hubert sent her home on. She does use [facility] on [address].

## 2023-03-12 NOTE — Telephone Encounter (Signed)
Called spoke to patient states she needs a refill on Oxycodone, Tizanidine and Diazepam. Per patient she has 24 tabs left of oxycodone.    Patient states she takes oxycodne when she hurts. Patient received a prescription for 120 of oxycodone on 02/24/23. Patient is aware she is over taking medication and can only take a max of 4 a day for 30 days. Per Dr. Levonne Hubert patient will have to make the 24 tabs last and she can't over take the medication. He will fill diazepam and tizanidine.    Prescriptions sent to Dr. Levonne Hubert to review and send to pharmacy.

## 2023-03-24 ENCOUNTER — Ambulatory Visit: Admit: 2023-03-24 | Discharge: 2023-03-24 | Payer: MEDICARE | Primary: Family Medicine

## 2023-03-24 ENCOUNTER — Ambulatory Visit: Admit: 2023-03-24 | Payer: MEDICARE | Primary: Family Medicine

## 2023-03-24 DIAGNOSIS — Z981 Arthrodesis status: Secondary | ICD-10-CM | POA: Diagnosis not present

## 2023-03-24 MED ORDER — GABAPENTIN 300 MG PO CAPS
300 | ORAL_CAPSULE | Freq: Three times a day (TID) | ORAL | 0 refills | Status: AC
Start: 2023-03-24 — End: 2023-04-23

## 2023-03-24 NOTE — Progress Notes (Signed)
Name: Kristin [name]  Date of Birth: 22-Dec-1960  Gender: female  MRN: [id number]  Age: 63 y.o.    Chief Complaint: Neck surgery follow up.    History of present illness:    Kristin [name] is here for 6-week follow up of her  posterior cervical laminectomy and fusion surgery. She reports some relief of preoperative upper extremity pain, weakness and parasthesias.  She still continues to have neuropathic pain in her hands.  Her balance has improved some.  The wound is benign.     Medications:   Current Outpatient Medications   Medication Sig    gabapentin (NEURONTIN) 300 MG capsule Take 1 capsule by mouth 3 times daily for 30 days. Intended supply: 30 days    tiZANidine (ZANAFLEX) 2 MG tablet Take 2 tablets by mouth every 6 hours as needed for muscle spasms (max 8 tabs a day)    oxyCODONE HCl (OXY-IR) 10 MG immediate release tablet Take 1 tablet by mouth every 6 hours as needed for Pain for up to 30 days. Intended supply: 30 days Max Daily Amount: 40 mg     No current facility-administered medications for this visit.       Allergies:   Allergies   Allergen Reactions    Aspirin Nausea Only    Celecoxib Nausea Only        Physical Exam:     Oriented to person, place and time.    Mood and affect are appropriate.    Respirations are unlabored and there is no evidence of cyanosis.    Wound is healed up nicely without erythema or underlying fluctuance.     Sensory testing reveals intact sensation to light touch.    Strength testing in the upper extremity reveals the following based on the 5 point grading scale:       Delt(C5) Bicep(C6) WE(C6) Tricep (C7) WF(C7) Grip(C8) Int (T1)   Right 5 5 5 5 5 5 5    Left 5 5 5 5 5 5 5        Radiographic Studies:    Radiographs:   2 views cervical spine (AP and Lateral) taken today, reveal postoperative changes status post instrumented fusion with excellent graft and hardware placement, and no interval decompensation from the immediate postoperative appearance.      Radiographic Impression: Satisfactory postoperative appearance, status post cervical fusion.      Diagnosis:      ICD-10-CM    1. S/P cervical spinal fusion  Z98.1 XR CERVICAL SPINE (2-3 VIEWS)          Assessment/Plan:       This patient is following the expected course following cervical surgery.     Once she is comfortable with neck ROM, she can begin driving.  I encouraged her to walk as much as possible for exercise. A stationary bike, elliptical machine, or other low impact aerobic exercise is also encouraged.  I will have her return for follow up in 6 weeks with repeat radiographs of the cervical spine.        Electronically signed by Quita Skye Levonne Hubert, MD  03/24/23  2:30 PM

## 2023-04-01 ENCOUNTER — Encounter

## 2023-04-01 MED ORDER — OXYCODONE-ACETAMINOPHEN 5-325 MG PO TABS
5-325 | ORAL_TABLET | Freq: Four times a day (QID) | ORAL | 0 refills | Status: AC | PRN
Start: 2023-04-01 — End: 2023-04-08

## 2023-04-01 NOTE — Telephone Encounter (Signed)
 She needs to get a refill of Oxycodone sent to the [facility] on file.

## 2023-04-01 NOTE — Telephone Encounter (Signed)
 Per Dr. Harold Barban prescription for Oxycodone 5/325mg  will be prescribed.    Prescription sent to Dr. Levonne Hubert to review and send to pharmacy. Patient is aware.

## 2023-04-01 NOTE — Telephone Encounter (Signed)
[facility] is calling about the rx that was sent in. She got Oxycodone on 2/4 and it was a 30 day supply. Also she has a [place] address but asked for it to be sent to [facility] which is pretty far away and [facility] is showing an [place] address for her but then we are in [place]. They just aren't sure what's going on with all of this. Please call [facility].

## 2023-04-01 NOTE — Telephone Encounter (Signed)
 Returned call spoke to Clydie Braun Scientist, research (physical sciences)) patient filled an Oxycodone prescription qty of 120 on 02/16/23, then they had the prescription from 02/24/23 on hold for Oxy qty 120 patient filled that prescription on 03/17/23. Clydie Braun is aware I will call Dr. Levonne Hubert and discuss with him and call her back.    Per Dr. Levonne Hubert let pharmacy know to keep prescription on hold that was prescribed on 04/01/23. I called spoke to pharmacist Clydie Braun) she is aware of the above.

## 2023-04-01 NOTE — Telephone Encounter (Signed)
I called spoke to patient she is aware Oxycodone Rx will be on hold at the pharmacy they will fill prescriptions when its due. Patient filled 120 qty Oxycodone prescription on 03/17/23 per [facility] pharmacy. Patient is advised to take no more that 4 tabs a day per sig prn. Patient verbalized understanding.

## 2023-04-21 ENCOUNTER — Encounter

## 2023-04-21 MED ORDER — GABAPENTIN 300 MG PO CAPS
300 MG | ORAL_CAPSULE | Freq: Three times a day (TID) | ORAL | 0 refills | Status: DC
Start: 2023-04-21 — End: 2023-05-27

## 2023-04-21 MED ORDER — TIZANIDINE HCL 2 MG PO TABS
2 MG | ORAL_TABLET | ORAL | 0 refills | Status: DC
Start: 2023-04-21 — End: 2023-05-27

## 2023-04-21 NOTE — Telephone Encounter (Signed)
 Prescriptions sent to Dr. Levonne Hubert to review and send to pharmacy.

## 2023-04-21 NOTE — Telephone Encounter (Signed)
 She is requesting a refill on gabapentin and tizanidine to [facility] in [place].

## 2023-04-23 NOTE — Telephone Encounter (Signed)
 Returned call to patient she is aware I called the pharmacy and the prescription from 04/01/23 is on hold. Patient is advised to call pharmacy request to fill the Oxycodone prescription. Patient verbalized understanding.

## 2023-04-23 NOTE — Telephone Encounter (Signed)
 She is requesting a refill on Oxycodone to [facility] in [place] on [address].

## 2023-05-05 ENCOUNTER — Ambulatory Visit: Admit: 2023-05-05 | Payer: MEDICARE | Primary: Family Medicine

## 2023-05-05 ENCOUNTER — Ambulatory Visit: Admit: 2023-05-05 | Discharge: 2023-05-05 | Payer: MEDICARE | Primary: Family Medicine

## 2023-05-05 DIAGNOSIS — Z981 Arthrodesis status: Secondary | ICD-10-CM | POA: Diagnosis not present

## 2023-05-05 NOTE — Progress Notes (Signed)
 Name: Kristin [name]  Date of Birth: May 31, 1960  Gender: female  MRN: [id number]  Age: 63 y.o.    Chief Complaint: Neck surgery follow up.    History of present illness:    Kristin [name] is here for 25-month follow up of her C3-C7 posterior cervical laminectomy and fusion surgery. She reports improvement in her balance.  She is using her walker sparingly now.  She is supposed to be seeing a hand surgeon regarding her carpal tunnel syndrome.  She denies any new weakness in her hands.  She does have the occasional stinging in the back of her neck..  The wound is benign.     Medications:   Current Outpatient Medications   Medication Sig    tiZANidine (ZANAFLEX) 2 MG tablet Take 2 tablets by mouth every 6 hours as needed for muscle spasms (max 8 tabs a day)    gabapentin (NEURONTIN) 300 MG capsule Take 1 capsule by mouth 3 times daily for 30 days. Intended supply: 30 days     No current facility-administered medications for this visit.       Allergies:   Allergies   Allergen Reactions    Aspirin Nausea Only    Celecoxib Nausea Only        Physical Exam:     Oriented to person, place and time.    Mood and affect are appropriate.    Respirations are unlabored and there is no evidence of cyanosis.    Wound is healed up nicely without erythema or underlying fluctuance.     Sensory testing reveals intact sensation to light touch.    Strength testing in the upper extremity reveals the following based on the 5 point grading scale:       Delt(C5) Bicep(C6) WE(C6) Tricep (C7) WF(C7) Grip(C8) Int (T1)   Right 5 5 5 5 5 5 5    Left 5 5 5 5 5 5 5        Radiographic Studies:    Radiographs:   2 views cervical spine (AP and Lateral) taken today, reveal postoperative changes status post instrumented fusion with excellent graft and hardware placement, and no interval decompensation from the immediate postoperative appearance.     Radiographic Impression: Satisfactory postoperative appearance, status post cervical  fusion.      Diagnosis:      ICD-10-CM    1. S/P cervical spinal fusion  Z98.1 XR CERVICAL SPINE (2-3 VIEWS)          Assessment/Plan:       This patient is following the expected course following cervical surgery.     I will have her continue to increase activities as tolerated and follow up in 3 months with repeat radiographs of the cervical spine, to assess for fusion consolidation.        Electronically signed by Quita Skye Levonne Hubert, MD  05/05/23  2:08 PM

## 2023-05-06 ENCOUNTER — Encounter: Payer: Self-pay | Admitting: Gastroenterology

## 2023-05-27 ENCOUNTER — Encounter

## 2023-05-27 MED ORDER — GABAPENTIN 300 MG PO CAPS
300 | ORAL_CAPSULE | Freq: Three times a day (TID) | ORAL | 0 refills | Status: AC
Start: 2023-05-27 — End: 2023-06-26

## 2023-05-27 MED ORDER — TIZANIDINE HCL 2 MG PO TABS
2 | ORAL_TABLET | ORAL | 0 refills | Status: AC
Start: 2023-05-27 — End: ?

## 2023-05-27 MED ORDER — HYDROCODONE-ACETAMINOPHEN 5-325 MG PO TABS
5-325 | ORAL_TABLET | Freq: Four times a day (QID) | ORAL | 0 refills | Status: AC | PRN
Start: 2023-05-27 — End: 2023-06-03

## 2023-05-27 NOTE — Telephone Encounter (Signed)
 She is requesting a refill on Oxycodone, Gabapentin, and tizanidine to [facility] in [place].

## 2023-05-27 NOTE — Telephone Encounter (Signed)
 Prescriptions sent to Dr. Carolina Cid to review and send to pharmacy.    Per Dr. Carolina Cid will prescribe Norco 5/325 mg weaning down from Oxycodone.

## 2023-07-14 ENCOUNTER — Encounter

## 2023-07-14 MED ORDER — TIZANIDINE HCL 2 MG PO TABS
2 | ORAL_TABLET | ORAL | 1 refills | 16.00 days | Status: AC
Start: 2023-07-14 — End: ?

## 2023-07-14 MED ORDER — HYDROCODONE-ACETAMINOPHEN 5-325 MG PO TABS
5-325 | ORAL_TABLET | Freq: Four times a day (QID) | ORAL | 0 refills | Status: AC | PRN
Start: 2023-07-14 — End: 2023-07-21

## 2023-07-14 MED ORDER — GABAPENTIN 300 MG PO CAPS
300 | ORAL_CAPSULE | Freq: Three times a day (TID) | ORAL | 1 refills | 30.00 days | Status: AC
Start: 2023-07-14 — End: 2023-08-13

## 2023-07-14 NOTE — Telephone Encounter (Signed)
 She needs to get a refill of her Gabapentin  300mg , Tizanidine , and Norco  sent to the pharmacy on file.

## 2023-07-14 NOTE — Telephone Encounter (Signed)
 Prescriptions sent to Dr. Levonne Hubert to review and send to pharmacy.

## 2023-07-30 NOTE — Telephone Encounter (Signed)
 JDW is out of office on 08/05/23 moved appt to 08/12/23. Called LM on pt vm to call office if need to reschedule.

## 2023-07-31 NOTE — Telephone Encounter (Signed)
 Patient has [facility] medicare and needs to see Dr. Jetta before the end of June.  Is there any availability for an appointment before the end of June?

## 2023-08-04 ENCOUNTER — Ambulatory Visit: Admit: 2023-08-04 | Discharge: 2023-08-04 | Payer: Medicare (Managed Care) | Primary: Family Medicine

## 2023-08-04 ENCOUNTER — Ambulatory Visit: Admit: 2023-08-04 | Payer: Medicare (Managed Care) | Primary: Family Medicine

## 2023-08-04 DIAGNOSIS — Z981 Arthrodesis status: Secondary | ICD-10-CM | POA: Diagnosis not present

## 2023-08-04 NOTE — Progress Notes (Signed)
 Name: Kristin [name]  Date of Birth: 09/13/1960  Gender: female  MRN: [id number]  Age: 63 y.o.    Chief Complaint: Neck surgery follow up.    History of present illness:    Kristin [name] is here for 80-month follow up of her  posterior cervical laminectomy and fusion surgery. She reports significant relief of preoperative upper extremity pain, weakness and parasthesias.  The wound is benign.     Medications:   Current Outpatient Medications   Medication Sig    gabapentin  (NEURONTIN ) 300 MG capsule Take 1 capsule by mouth 3 times daily for 30 days. Intended supply: 30 days    tiZANidine  (ZANAFLEX ) 2 MG tablet Take 2 tablets by mouth every 6 hours as needed for muscle spasms (max 8 tabs a day)     No current facility-administered medications for this visit.       Allergies:   Allergies   Allergen Reactions    Aspirin Nausea Only    Celecoxib Nausea Only        Physical Exam:     Oriented to person, place and time.    Mood and affect are appropriate.    Respirations are unlabored and there is no evidence of cyanosis.    Wound is healed up nicely without erythema or underlying fluctuance.     Sensory testing reveals intact sensation to light touch.    Strength testing in the upper extremity reveals the following based on the 5 point grading scale:       Delt(C5) Bicep(C6) WE(C6) Tricep (C7) WF(C7) Grip(C8) Int (T1)   Right 5 5 5 5 5 5 5    Left 5 5 5 5 5 5 5        Radiographic Studies:    Radiographs:   2 views cervical spine (AP and Lateral) taken today, reveal postoperative changes status post instrumented fusion with excellent graft and hardware placement, and no interval decompensation from the immediate postoperative appearance.     Radiographic Impression: Satisfactory postoperative appearance, status post cervical fusion.      Diagnosis:      ICD-10-CM    1. S/P cervical spinal fusion  Z98.1 XR CERVICAL SPINE (2-3 VIEWS)          Assessment/Plan:       This patient is following the expected course  following cervical surgery.     I will have her continue activities as tolerated and return for follow up as needed.        Electronically signed by Lynwood CORDOBA Jetta, MD  08/04/23  3:37 PM

## 2023-08-05 ENCOUNTER — Encounter: Payer: Medicare (Managed Care) | Primary: Family Medicine

## 2023-08-12 ENCOUNTER — Encounter: Payer: Medicare (Managed Care) | Primary: Family Medicine

## 2023-08-18 ENCOUNTER — Encounter: Payer: Medicare (Managed Care) | Primary: Family Medicine

## 2023-10-13 DIAGNOSIS — G5603 Carpal tunnel syndrome, bilateral upper limbs: Secondary | ICD-10-CM | POA: Diagnosis not present

## 2023-10-13 DIAGNOSIS — G5621 Lesion of ulnar nerve, right upper limb: Secondary | ICD-10-CM | POA: Diagnosis not present

## 2023-10-29 DIAGNOSIS — Z886 Allergy status to analgesic agent status: Secondary | ICD-10-CM | POA: Diagnosis not present

## 2023-10-29 DIAGNOSIS — G5621 Lesion of ulnar nerve, right upper limb: Secondary | ICD-10-CM | POA: Diagnosis not present

## 2023-10-29 DIAGNOSIS — G5603 Carpal tunnel syndrome, bilateral upper limbs: Secondary | ICD-10-CM | POA: Diagnosis not present

## 2023-10-29 DIAGNOSIS — G5601 Carpal tunnel syndrome, right upper limb: Secondary | ICD-10-CM | POA: Diagnosis not present
